# Patient Record
Sex: Male | Born: 1975 | Marital: Married | Smoking: Current every day smoker
Health system: Southern US, Community
[De-identification: ages and names within clinical notes are randomized; demographics above are authoritative.]

## PROBLEM LIST (undated history)

## (undated) DIAGNOSIS — R7303 Prediabetes: Secondary | ICD-10-CM

## (undated) DIAGNOSIS — E7800 Pure hypercholesterolemia, unspecified: Secondary | ICD-10-CM

## (undated) DIAGNOSIS — Z720 Tobacco use: Secondary | ICD-10-CM

## (undated) DIAGNOSIS — I2699 Other pulmonary embolism without acute cor pulmonale: Secondary | ICD-10-CM

## (undated) HISTORY — DX: Tobacco use: Z72.0

## (undated) HISTORY — DX: Pure hypercholesterolemia, unspecified: E78.00

## (undated) HISTORY — PX: COLONOSCOPY: SHX174

## (undated) HISTORY — DX: Other pulmonary embolism without acute cor pulmonale: I26.99

## (undated) HISTORY — DX: Prediabetes: R73.03

---

## 2002-08-14 ENCOUNTER — Emergency Department (HOSPITAL_COMMUNITY): Admission: EM | Admit: 2002-08-14 | Discharge: 2002-08-14 | Payer: Self-pay | Admitting: Emergency Medicine

## 2009-04-03 ENCOUNTER — Ambulatory Visit (HOSPITAL_COMMUNITY): Admission: AD | Admit: 2009-04-03 | Discharge: 2009-04-05 | Payer: Self-pay | Admitting: Family Medicine

## 2009-04-03 ENCOUNTER — Ambulatory Visit: Payer: Self-pay | Admitting: Family Medicine

## 2009-04-03 ENCOUNTER — Encounter: Admission: RE | Admit: 2009-04-03 | Discharge: 2009-04-03 | Payer: Self-pay | Admitting: Family Medicine

## 2009-04-04 ENCOUNTER — Encounter: Admission: AD | Admit: 2009-04-04 | Discharge: 2009-04-06 | Payer: Self-pay | Admitting: Family Medicine

## 2009-04-04 ENCOUNTER — Ambulatory Visit: Payer: Self-pay | Admitting: Vascular Surgery

## 2009-04-04 ENCOUNTER — Encounter: Payer: Self-pay | Admitting: Family Medicine

## 2009-04-11 DIAGNOSIS — F17200 Nicotine dependence, unspecified, uncomplicated: Secondary | ICD-10-CM

## 2009-04-12 ENCOUNTER — Ambulatory Visit: Payer: Self-pay | Admitting: Internal Medicine

## 2009-04-12 DIAGNOSIS — I2699 Other pulmonary embolism without acute cor pulmonale: Secondary | ICD-10-CM | POA: Insufficient documentation

## 2009-09-26 ENCOUNTER — Telehealth: Payer: Self-pay | Admitting: Internal Medicine

## 2009-10-17 ENCOUNTER — Ambulatory Visit: Payer: Self-pay | Admitting: Internal Medicine

## 2009-10-17 LAB — CONVERTED CEMR LAB
Anticardiolipin IgG: 3 (ref <23)
Protein C Activity: 132 % (ref 75–133)
Protein S Activity: 113 % (ref 69–129)
Protein S Ag, Total: 90 % (ref 70–140)

## 2009-10-25 ENCOUNTER — Telehealth: Payer: Self-pay | Admitting: Internal Medicine

## 2009-11-30 ENCOUNTER — Ambulatory Visit: Payer: Self-pay | Admitting: Cardiology

## 2009-11-30 LAB — CONVERTED CEMR LAB: POC INR: 1

## 2009-12-07 ENCOUNTER — Ambulatory Visit: Payer: Self-pay | Admitting: Cardiovascular Disease

## 2009-12-07 LAB — CONVERTED CEMR LAB: POC INR: 2.1

## 2009-12-14 ENCOUNTER — Ambulatory Visit: Payer: Self-pay | Admitting: Internal Medicine

## 2009-12-14 LAB — CONVERTED CEMR LAB: POC INR: 1.9

## 2009-12-28 ENCOUNTER — Ambulatory Visit: Payer: Self-pay | Admitting: Cardiology

## 2010-01-18 ENCOUNTER — Ambulatory Visit: Payer: Self-pay | Admitting: Cardiology

## 2010-01-18 LAB — CONVERTED CEMR LAB: POC INR: 1.7

## 2010-02-01 ENCOUNTER — Ambulatory Visit: Payer: Self-pay | Admitting: Internal Medicine

## 2010-02-01 LAB — CONVERTED CEMR LAB: POC INR: 2

## 2010-03-01 ENCOUNTER — Ambulatory Visit: Admit: 2010-03-01 | Payer: Self-pay

## 2010-03-07 ENCOUNTER — Ambulatory Visit: Admission: RE | Admit: 2010-03-07 | Discharge: 2010-03-07 | Payer: Self-pay | Source: Home / Self Care

## 2010-03-19 NOTE — Medication Information (Signed)
Summary: ccn/. 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 no    Recent/future dental: no  Any missed doses?: no       Is patient compliant with meds? yes      Comments: Has been off anticoagulation since early September.  Current Medications (verified): 1)  None  Allergies (verified): No Known Drug Allergies  Anticoagulation Management History:      The patient is taking warfarin and comes in today for a routine follow up visit.  Negative risk factors for bleeding include an age less than 61 years old.  The bleeding index is 'low risk'.  Negative CHADS2 values include Age > 75 years old.  Anticoagulation responsible provider: Myrtis Ser MD, Tinnie Gens.  INR POC: 1.0.  Cuvette Lot#: [id number].    Anticoagulation Management Assessment/Plan:      The next INR is due 12/07/2009.  Results were reviewed/authorized by Reina Fuse, PharmD.  He was notified by Reina Fuse PharmD.         Current Anticoagulation Instructions: INR 1.0  Take 5 mg (1 tablet) every day. Return to clinic in 1 week.

## 2010-03-19 NOTE — Medication Information (Signed)
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 no    Recent/future dental: no  Any missed doses?: no       Is patient compliant with meds? yes       Allergies: No Known Drug Allergies  Anticoagulation Management History:      The patient is taking warfarin and comes in today for a routine follow up visit.  Negative risk factors for bleeding include an age less than 76 years old.  The bleeding index is 'low risk'.  Negative CHADS2 values include Age > 71 years old.  Anticoagulation responsible provider: Bensimhon MD, Reuel Boom.  INR POC: 1.9.  Cuvette Lot#: [id number].  Exp: 01/2011.    Anticoagulation Management Assessment/Plan:      The patient's current anticoagulation dose is Warfarin sodium 5 mg tabs: Take as directed by anticoagulation clinic..  The target INR is 2-3.  The next INR is due 12/28/2009.  Anticoagulation instructions were given to patient.  Results were reviewed/authorized by Bethena Midget, RN, BSN.  He was notified by Bethena Midget, RN, BSN.         Prior Anticoagulation Instructions: INR 2.1  Continue Coumadin as scheduled:  1 tablet every day of the week.  Return to clinic in 1 week.    Current Anticoagulation Instructions: INR 1.9 Today 7.5mg s then change 5mg s daily except 7.5mg s on Wednesdays. Recheck in 2 weeks.

## 2010-03-19 NOTE — Medication Information (Signed)
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 yes       Details: took IBU for a few days  Recent/future dental: no  Any missed doses?: no       Is patient compliant with meds? yes       Allergies: No Known Drug Allergies  Anticoagulation Management History:      The patient is taking warfarin and comes in today for a routine follow up visit.  Negative risk factors for bleeding include an age less than 28 years old.  The bleeding index is 'low risk'.  Negative CHADS2 values include Age > 31 years old.  Anticoagulation responsible provider: Jens Som MD, Arlys John.  INR POC: 1.7.  Cuvette Lot#: [id number].  Exp: 01/2011.    Anticoagulation Management Assessment/Plan:      The patient's current anticoagulation dose is Warfarin sodium 5 mg tabs: Take as directed by anticoagulation clinic..  The target INR is 2-3.  The next INR is due 02/01/2010.  Anticoagulation instructions were given to patient.  Results were reviewed/authorized by Weston Brass, PharmD.  He was notified by Weston Brass PharmD.         Prior Anticoagulation Instructions: INR 2.4 Continue previous dose of 1 tablet everyday except 1.5 tablets on WEdnesday Recheck INR in 2 weeks  Current Anticoagulation Instructions: INR 1.7  Take 1 1/2 tablets today then increase dose to 1 tablet every day except 1 1/2 tablets on Wednesday and Saturday.  Recheck INR in 2 weeks.

## 2010-03-19 NOTE — Progress Notes (Signed)
Summary: DOCTOR REQUESTING TO SPEAK TO MR  Phone Note From Other Clinic Call back at [phone]   Caller: Dr. Patsy Lager Call For: [facility] Summary of Call: Dr. Patsy Lager wants to speak to MR when he comes in ref to research study this pt is involved in. Initial call taken by: Darletta Moll,  September 26, 2009 10:48 AM  Follow-up for Phone Call        Informed Chryl Heck. (who will be working with MR today) of this so she can remind him.  Will also forward message to MR so he is aware.  Gweneth Dimitri RN  September 26, 2009 10:53 AM   Additional Follow-up for Phone Call Additional follow up Details #1::        spoke to PRimary MD yesterday 09/26/2009. Advised to finish 6 month Rivoroxaban later this month. Then, seem off all RX a few weeks later and I will discuss hypercoag wu and furhter coumadin Rx  Jen: pls give appt for early september Additional Follow-up by: Kalman Shan MD,  September 27, 2009 10:27 AM    Additional Follow-up for Phone Call Additional follow up Details #2::    LMTCB. Carron Curie CMA  October 02, 2009 12:33 PM LMTCBx2. Carron Curie CMA  October 05, 2009 11:07 AM  pt set to see MR on 10-17-09. Carron Curie CMA  October 09, 2009 1:32 PM

## 2010-03-19 NOTE — Assessment & Plan Note (Signed)
Summary: rov/ mbw   Visit Type:  Follow-up Primary Provider/Referring Provider:  Dr Karleen Hampshire Copland  CC:  Pt here for follow-up. Pt denies any breathing problems. .  History of Present Illness: OV Feb 3158: 35 year old [place] male developed sudden onset of severe progressive dyspnea with exertioin but relivied by rest and associated wtih chest tightness around 04/01/2009. Diagnosed and admitted for bilateral pulmonary embolism on 04/03/2009 with associated Left popliteal vein DVT that did not appear acute. Review of admission chart suggests low PESI score - Class 1 with normal soidium levels implying a good prognosis. Initially had sinus tachycardia but this resolved soon. BP, POx, RR, mentnal status were all normal. Clinically did not have RV strain but we do not have ECHO, BNP or troponin. He is being treated with [facility] on the [facility] trial. In terms of risk factors, he now recollects that some weeks before PE he sustained a kick injyr on the left calf and there was some pain and bruising with it; this was the site of left poplieal dVT. He also had a 6h car trip to [place] but took lot of breaks. He is a smoker and there is possibly a family hx of PE/DVT. Of note, he did not have genetic workup for PE before admission. In any event, currently is asymptomatic and feels totally normal. He desires to quit smoking and is enquiring about it.   REC - finish 6 month rivorxaban  - quit smoking with chantix  - get more details on maternal grandfather's death from 'clot'   OV 2009-11-11: Followup PE/DVT and Smoking. Now sp 6 month rivoraxaban Rx for PE. Compeleted Rx few weesk ago. Feels well overall. Does note dyspnea 30 minutes into soccer game. This is improved since PE diagnosis but a few years ago he could play an hour before noticing dyspnea. He thinks it is due to weight gain and deconditioning. Still smokkes - never started chantix. Understands need to quit. Denies chest  pain, hemoptysis, edema, cough, syncope.    Preventive Screening-Counseling & Management  Alcohol-Tobacco     Smoking Status: current     Smoking Cessation Counseling: yes     Smoke Cessation Stage: contemplative     Packs/Day: 1.5     Year Started: 1994     Pack years: 25.5     Tobacco Counseling: to quit use of tobacco products  Comments: done but I dont think he is ready  Current Medications (verified): 1)  None  Allergies (verified): No Known Drug Allergies  Past History:  Past medical, surgical, family and social histories (including risk factors) reviewed, and no changes noted (except as noted below).  Past Medical History: Reviewed history from 04/12/2009 and no changes required. Current Problems:  TOBACCO ABUSE (ICD-305.1) PULMONARY EMBOLISM (ICD-415.19)   EKG at [place] sinus rhythm, tachycardiac.   Chest x-  ray at [facility] within normal limits.   Flu was negative.   CT angiogram is  reported to be positive for bilateral pulmonary embolisms.   CBC showed a white count of 9.2,  hemoglobin 15.1, and platelets 226.     Family History: Reviewed history from 04/12/2009 and no changes required. mother: HTN and anemia father: angina brother: good health Maternal grandfather was bedridden for 1 month and died of a 'clot" when patient was a teen. Further details not known. Updated this again oon 2009-11-11 - he says now that unclear what etiology of death  Social History: Reviewed history from 04/12/2009 and  no changes required. pt is married and lives with his wife. 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 pt smokes 1 1/2 ppd x 17 years.    He lives with his wife.  He works at [employer],   which he and his wife own.  He smokes a half pack per day of cigarettes.   He has been smoking for 17 years and used to smoke up to 3 packs per   day.  He does not drink, he does not do drugs. Smoking Status:  current Packs/Day:  1.5 Pack years:   25.5  Review of Systems  The patient denies shortness of breath with activity, shortness of breath at rest, productive cough, non-productive cough, coughing up blood, chest pain, irregular heartbeats, acid heartburn, indigestion, loss of appetite, weight change, abdominal pain, difficulty swallowing, sore throat, tooth/dental problems, headaches, nasal congestion/difficulty breathing through nose, sneezing, itching, ear ache, anxiety, depression, hand/feet swelling, joint stiffness or pain, rash, change in color of mucus, and fever.    Vital Signs:  Patient profile:   35 year old male Height:      72 inches Weight:      208.25 pounds O2 Sat:      95 % on Room air Temp:     98.8 degrees F oral Pulse rate:   78 / minute BP sitting:   116 / 74  (right arm) Cuff size:   regular  Vitals Entered By: Carron Curie CMA (October 17, 2009 4:01 PM)  O2 Flow:  Room air CC: Pt here for follow-up. Pt denies any breathing problems.    Physical Exam  General:  well developed, well nourished, in no acute distress Head:  normocephalic and atraumatic Eyes:  PERRLA/EOM intact; conjunctiva and sclera clear Ears:  TMs intact and clear with normal canals Nose:  no deformity, discharge, inflammation, or lesions Mouth:  no deformity or lesions Neck:  no masses, thyromegaly, or abnormal cervical nodes Chest Wall:  no deformities noted Lungs:  clear bilaterally to auscultation and percussion Heart:  regular rate and rhythm, S1, S2 without murmurs, rubs, gallops, or clicks Abdomen:  bowel sounds positive; abdomen soft and non-tender without masses, or organomegaly Msk:  no deformity or scoliosis noted with normal posture Pulses:  pulses normal Extremities:  no clubbing, cyanosis, edema, or deformity noted Neurologic:  CN II-XII grossly intact with normal reflexes, coordination, muscle strength and tone Skin:  intact without lesions or rashes Cervical Nodes:  no significant adenopathy Axillary Nodes:   no significant adenopathy Psych:  alert and cooperative; normal mood and affect; normal attention span and concentration   Impression & Recommendations:  Problem # 1:  PULMONARY EMBOLISM (ICD-415.1)  Orders: T- * Misc. Laboratory test [phone]) Tobacco use cessation intermediate 3-10 minutes ([id number])  He had Class 1 PESI score bilateral  PE with normal sodium on 04/03/2009. This was associated with LLE DVT in popliteal vein of indeterminate age. Based on this it is likely that the soccer injury to left calf weeks before admission associated with car trip to [place] in setting of cigarette smoking and possibile/likely family hx brought on PE.  > On 10/17/2009: s/p 6 months of rivoroxaban on [employer]  PROTOCOL TRIAL. Dpoing well. Some dyspnea 30 minutes into soccier game that is improved but not at baselne. STil smoking.   PLAN - Get hypercoag panel -> depending on abnormality he might need anticoag for life I- F negative, then would recommend total 1-4 years of  coumading for idiopathich PE with first year at INR >2 and then at INR > 1.5 - He refused this option though he undersands risk for recurrence is in order of 10-20% - Informed that if 2nd PE need anticoag for life - he understands - Quit smoking (a risk factor for PE) - Monitor dyspnea (he wants to clinically monitor this )  - FU depending on hypercoag resultsbut he prefers q 6months - q 1 year treatment  Problem # 2:  TOBACCO ABUSE (ICD-305.1) Assessment: Unchanged 5 minute counselling to quit esp in light of it being a PE Risk factor. HE is going to try on own.  Orders: T- * Misc. Laboratory test [phone]) Tobacco use cessation intermediate 3-10 minutes ([id number])  Other Orders: Est. Patient Level III (69629)  Patient Instructions: 1)  please have hyercoagulable workup 2)  I will call you with resutls and decide followup 3)  quit smoking please

## 2010-03-19 NOTE — Medication Information (Signed)
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 no    Recent/future dental: no  Any missed doses?: no       Is patient compliant with meds? yes       Allergies: No Known Drug Allergies  Anticoagulation Management History:      The patient is taking warfarin and comes in today for a routine follow up visit.  Negative risk factors for bleeding include an age less than 31 years old.  The bleeding index is 'low risk'.  Negative CHADS2 values include Age > 80 years old.  Anticoagulation responsible Jenetta Wease: Kristeen Miss.  INR POC: 2.1.  Cuvette Lot#: [id number].  Exp: 12/2010.    Anticoagulation Management Assessment/Plan:      The patient's current anticoagulation dose is Warfarin sodium 5 mg tabs: Take as directed by anticoagulation clinic..  The target INR is 2-3.  The next INR is due 12/14/2009.  Results were reviewed/authorized by Cloyde Reams, RN, BSN.  He was notified by Haynes Hoehn, PharmD Candidate.         Prior Anticoagulation Instructions: INR 1.0  Take 5 mg (1 tablet) every day. Return to clinic in 1 week.   Current Anticoagulation Instructions: INR 2.1  Continue Coumadin as scheduled:  1 tablet every day of the week.  Return to clinic in 1 week.   Prescriptions: WARFARIN SODIUM 5 MG TABS (WARFARIN SODIUM) Take as directed by anticoagulation clinic.  #30 x 1   Entered by:   Reina Fuse PharmD   Authorized by:   Sherrill Raring, MD, [facility]   Signed by:   Reina Fuse PharmD on 12/07/2009   Method used:   Electronically to        [facility]       [address]       [place], [place]  [address]       Ph: [phone]       Fax: [phone]   RxID:   [id number]

## 2010-03-19 NOTE — Assessment & Plan Note (Signed)
Summary: research pt- PE/apc   Visit Type:  Initial Consult Primary Provider/Referring Provider:  [facility]   CC:  Pt here for Pulmonary Consult. Pt is a research patient. Pt states he is "back to his usual self." .  History of Present Illness: 35 year old [place] male developed sudden onset of severe progressive dyspnea with exertioin but relivied by rest and associated wtih chest tightness around 04/01/2009. Diagnosed and admitted for bilateral pulmonary embolism on 04/03/2009 with associated Left popliteal vein DVT that did not appear acute. Review of admission chart suggests low PESI score - Class 1 with normal soidium levels implying a good prognosis. Initially had sinus tachycardia but this resolved soon. BP, POx, RR, mentnal status were all normal. Clinically did not have RV strain but we do not have ECHO, BNP or troponin. He is being treated with [facility] on the [facility] trial. In terms of risk factors, he now recollects that some weeks before PE he sustained a kick injyr on the left calf and there was some pain and bruising with it; this was the site of left poplieal dVT. He also had a 6h car trip to [place] but took lot of breaks. He is a smoker and there is possibly a family hx of PE/DVT. Of note, he did not have genetic workup for PE before admission  In any event, currently is asymptomatic and feels totally normal. He desires to quit smoking and is enquiring about it.   Current Medications (verified): 1)  Research Med .... Not Sure of Name  Allergies (verified): No Known Drug Allergies  Past History:  Family History: Last updated: 04/12/2009 mother: HTN and anemia father: angina brother: good health Maternal grandfather was bedridden for 1 month and died of a 'clot" when patient was a teen. Further details not known  Social History: Last updated: 04/12/2009 pt is married and lives with his wife. 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 pt  smokes 1 1/2 ppd x 17 years.    He lives with his wife.  He works at [employer],   which he and his wife own.  He smokes a half pack per day of cigarettes.   He has been smoking for 17 years and used to smoke up to 3 packs per   day.  He does not drink, he does not do drugs.   Past Medical History: Current Problems:  TOBACCO ABUSE (ICD-305.1) PULMONARY EMBOLISM (ICD-415.19)   EKG at [facility] sinus rhythm, tachycardiac.   Chest x-  ray at [facility] within normal limits.   Flu was negative.   CT angiogram is  reported to be positive for bilateral pulmonary embolisms.   CBC showed a white count of 9.2,  hemoglobin 15.1, and platelets 226.     Family History: mother: HTN and anemia father: angina brother: good health Maternal grandfather was bedridden for 1 month and died of a 'clot" when patient was a teen. Further details not known  Social History: pt is married and lives with his wife. 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 pt smokes 1 1/2 ppd x 17 years.    He lives with his wife.  He works at [employer],   which he and his wife own.  He smokes a half pack per day of cigarettes.   He has been smoking for 17 years and used to smoke up to 3 packs per   day.  He does not drink, he does not do drugs.   Vital Signs:  Patient profile:   35 year old male Height:      72 inches Weight:      213 pounds BMI:     28.99 O2 Sat:      97 % on Room air Temp:     98.2 degrees F oral Pulse rate:   92 / minute BP sitting:   108 / 72  (left arm) Cuff size:   regular  Vitals Entered By: Carron Curie CMA (April 12, 2009 3:29 PM)  O2 Flow:  Room air CC: Pt here for Pulmonary Consult. Pt is a research patient. Pt states he is "back to his usual self."  Comments Medications reviewed with patient Carron Curie CMA  April 12, 2009 3:30 PM Daytime phone number verified with patient.  Ambulatory Pulse Oximetry  Resting; HR_93____    02 Sat__94%  ra___  Lap1 (185 feet)   HR_92____   02 Sat__95% RA___ Lap2 (185 feet)   HR_95____   02 Sat___94% RA__    Lap3 (185 feet)   HR_103____   02 Sat__94% RA___  ___Test Completed without Difficulty ___Test Stopped due to:  Gweneth Dimitri RN  April 12, 2009 4:01 PM     Physical Exam  General:  well developed, well nourished, in no acute distress Head:  normocephalic and atraumatic Eyes:  PERRLA/EOM intact; conjunctiva and sclera clear Ears:  TMs intact and clear with normal canals Nose:  no deformity, discharge, inflammation, or lesions Mouth:  no deformity or lesions Neck:  no masses, thyromegaly, or abnormal cervical nodes Chest Wall:  no deformities noted Lungs:  clear bilaterally to auscultation and percussion Heart:  regular rate and rhythm, S1, S2 without murmurs, rubs, gallops, or clicks Abdomen:  bowel sounds positive; abdomen soft and non-tender without masses, or organomegaly Msk:  no deformity or scoliosis noted with normal posture Pulses:  pulses normal Extremities:  no clubbing, cyanosis, edema, or deformity noted Neurologic:  CN II-XII grossly intact with normal reflexes, coordination, muscle strength and tone Skin:  intact without lesions or rashes Cervical Nodes:  no significant adenopathy Axillary Nodes:  no significant adenopathy Psych:  alert and cooperative; normal mood and affect; normal attention span and concentration   CT of Chest  Procedure date:  04/03/2009  Findings:      Findings:  Filling defects are present in the pulmonary arteries   bilaterally compatible with moderate pulmonary emboli.  These are   present in the upper and lower lobe pulmonary arteries bilaterally.    The heart size is normal.  The aorta is normal.  The lungs are   clear without infiltrate or effusion.  There is no mass or   adenopathy.    Review of the MIP images confirms the above findings.    IMPRESSION:   Moderately extensive bilateral pulmonary  emboli.  Comments:      independelty revieweed  MISC. Report  Procedure date:  04/03/2009  Findings:      ESR 6 Na 136 Hgb 14.4gm% creat 1.36 alb 3.7gm%  MISC. Report  Procedure date:  04/04/2009  Findings:      Summary:    - No evidence of deep vein thrombosis involving the right lower     extremity.   - Findings consistent with indeterminate age deep vein thrombosis     involving the left lower extremity popliteal vein   - No evidence of Baker's cyst on the right or left.  Impression & Recommendations:  Problem # 1:  PULMONARY EMBOLISM (ICD-415.1) Assessment New He had Class 1 PESI score bilateral  PE with normal sodium on 04/03/2009. This was associated with LLE DVT in popliteal vein of indeterminate age. Based on this it is likely that the soccer injury to left calf weeks before admission associated with car trip to [place] in setting of cigarette smoking and possibile/likely family hx brought on PE.  PLAN I explained above to him Discussed risks of liver toxiicty and hemorrhage with PE treatment Expalined risk of recurrent fatal PE if he is non compliant Stressed importance of quitting smoking Asked him to get more family info about maternal grandpa who had a 'clot' We discussed duration of Rx. Due to the fact smoking and posible family hx: I would be inclinded to treat him beyond 6 months  I told him that a 2nd PE would mean anticoagulation for life Therefore might want to swithc Rivoroxaban to coumadin after 6 months (the trial duration) and continue for 1-2 years depending on willngness and tolerance He is willing for abovve At end of 6 months will consider gentic testing  Problem # 2:  TOBACCO ABUSE (ICD-305.1) Assessment: New  discussed for 5 monutes. Wants to quit. Chantix given. Has tried it in past without problems. Knows to report side effects of hallucinations, dreams etc., once it occurs. No firearms at home His updated medication list for this  problem includes:    Chantix Starting Month Pak 0.5 Mg X 11 & 1 Mg X 42 Misc (Varenicline tartrate) .[facility]... Take as directed---refills are to be for the continuing pak  Orders: New Patient Level V (60454) Tobacco use cessation intermediate 3-10 minutes (09811)  Medications Added to Medication List This Visit: 1)  Research Med  .... Not sure of name 2)  Chantix Starting Month Pak 0.5 Mg X 11 & 1 Mg X 42 Misc (Varenicline tartrate) .... Take as directed---refills are to be for the continuing pak  Patient Instructions: 1)  continue research treatment protocol 2)  take chantix as directed 3)  we will hold off on you seeing the blood doctor 4)  return to see me in 2-3 months  5)  quitting smoking is veyr important to help reduce your clot risk 6)  plesae find out how exactly your grandfather died and call me back Prescriptions: CHANTIX STARTING MONTH PAK 0.5 MG X 11 & 1 MG X 42  MISC (VARENICLINE TARTRATE) Take as directed---Refills are to be for the continuing pak  #1 x 3   Entered and Authorized by:   Kalman Shan MD   Signed by:   Kalman Shan MD on 04/12/2009   Method used:   Print then Give to Patient   RxID:   [id number]    Immunization History:  Influenza Immunization History:    Influenza:  fluvax 3+ (11/20/2008)

## 2010-03-19 NOTE — Medication Information (Signed)
Summary: rov/tm  Anticoagulant Therapy  Managed by: Weston Brass, PharmD Referring MD: Marchelle Gearing MD, Carmin Muskrat PCP: Dr Hannah Beat Supervising MD: Antoine Poche MD, Fayrene Fearing Indication 1: DVT/PE (recurrent or continuing risk factors) Lab Used: [facility] [place] Site: [address] INR POC 2.4 INR RANGE 2.0-3.0  Dietary changes: no    Health status changes: no    Bleeding/hemorrhagic complications: no    Recent/future hospitalizations: no    Any changes in medication regimen? no    Recent/future dental: no  Any missed doses?: no       Is patient compliant with meds? yes       Allergies: No Known Drug Allergies  Anticoagulation Management History:      The patient is taking warfarin and comes in today for a routine follow up visit.  Negative risk factors for bleeding include an age less than 15 years old.  The bleeding index is 'low risk'.  Negative CHADS2 values include Age > 38 years old.  Anticoagulation responsible Eriko Economos: Antoine Poche MD, Fayrene Fearing.  INR POC: 2.4.  Cuvette Lot#: [id number].  Exp: 12/2010.    Anticoagulation Management Assessment/Plan:      The patient's current anticoagulation dose is Warfarin sodium 5 mg tabs: Take as directed by anticoagulation clinic..  The target INR is 2-3.  The next INR is due 01/11/2010.  Anticoagulation instructions were given to patient.  Results were reviewed/authorized by Weston Brass, PharmD.  He was notified by Hoy Register, PharmD Candidate.         Prior Anticoagulation Instructions: INR 1.9 Today 7.5mg s then change 5mg s daily except 7.5mg s on Wednesdays. Recheck in 2 weeks.   Current Anticoagulation Instructions: INR 2.4 Continue previous dose of 1 tablet everyday except 1.5 tablets on WEdnesday Recheck INR in 2 weeks Prescriptions: WARFARIN SODIUM 5 MG TABS (WARFARIN SODIUM) Take as directed by anticoagulation clinic.  #90 x 0   Entered by:   Weston Brass PharmD   Authorized by:   Ferman Hamming, MD, [facility]  Signed by:   Weston Brass PharmD on 12/28/2009   Method used:   Electronically to        [facility] (retail)       [address]       [place], [place]  [address]       Ph: [phone]       Fax: [phone]   RxID:   [id number]

## 2010-03-19 NOTE — Progress Notes (Signed)
Summary: start coumadin-  Phone Note Outgoing Call   Call placed by: Kalman Shan MD,  October 25, 2009 6:18 PM Call placed to: Patient Summary of Call: hypercoag workup negative. Informed patient. Discussed further coumadin Rx. AGain explained that 2nd PE means coumadin for life. But risk for 2nd PE can be cut down if he did coumadin for another 1-4 years of which first year at INR >2 and then perhaps INR > 1.5. He is now willing to do coumadin for INR >2 for another 1 year.   So, pls give him appt at coumadin clinic to start coumadin (? needs lovenox bridge - have emailed Dr. Cyndie Chime about it).  He wants appt in 1-2 weeks . Pls call him and coordinate  FU with me in 10-12 months Initial call taken by: Kalman Shan MD,  October 25, 2009 6:21 PM  Follow-up for Phone Call        UPDATE: I got word unexpectedly from Dr. Riley Churches that [facility] is now available in the [place] commercially for stroke. He could take it for PE like he was but we need to get insurance approval etc. and need to figure out cost. Benefit is no monitoring. So, please tell him I will be calling next week to discuss again Follow-up by: Kalman Shan MD,  October 26, 2009 4:18 PM  Additional Follow-up for Phone Call Additional follow up Details #1::        spoke to patient. Explained rivoraxban available i [place]. Studies show efficacy equvalence in DVT and A Fib. PE results not published yet. Could be expensive. HE still prefers rivoroxaban over coumadin despite the unkonws and limitations. Wants me to try to get it approved with insurance.   So, [employer] direct and see what they say Additional Follow-up by: Kalman Shan MD,  October 30, 2009 1:41 PM    Additional Follow-up for Phone Call Additional follow up Details #2::    Called number in computer for insurance  that is listed but they states the pt insurance expired on 03-2009, so I have lMTCB with the pt to verify  insurance information since there is no updated info in EMR. Carron Curie CMA  November 01, 2009 11:56 AM   Pt returned call to [place].  He states he does have insurance but it is new.  Will fax copy of front and back of new card to traige fax # Attn: Victorino Dike.  Jennifer aware.   Follow-up by: Gweneth Dimitri RN,  November 05, 2009 11:12 AM  Additional Follow-up for Phone Call Additional follow up Details #3:: Details for Additional Follow-up Action Taken: I called pt insurance and spoke to Joanie Coddington, PharmD an dshe advised Rivoroxaban would be $240 per 34 day supply. I called and spoke to coumadin clinic and they advised the cost for appt would be specialists copay and the coumadin is on [facility] $4 list. I will forward infor to MR. Carron Curie CMA  November 19, 2009 2:08 PM   Called patient. Coumadin will be substantially cheaper in long run. Upfront with coumadin copays and INR checks will cost him same of rivroxaban but that will be like tha for first month or so. Then, cost will drop. He has to take Rx for long time so better he goes on coumadin. I called to explain this but LMTCB. IF he calls back, Jen pls tell him so and set him up at coumadin clinic Additional Follow-up by: Carmin Muskrat  Swayze Pries MD,  November 22, 2009 6:45 PM  advised pt of the above and he is ok to go to coumadin clinic. I have placed an order top [facility] to set pt up at Coumadin clinic. Carron Curie CMA  November 29, 2009 3:23 PM

## 2010-03-21 NOTE — Medication Information (Signed)
Summary: rov/sp  Anticoagulant Therapy  Managed by: Louann Sjogren, PharmD Referring MD: Marchelle Gearing MD, Carmin Muskrat PCP: Dr Hannah Beat Supervising MD: Daleen Squibb MD, Maisie Fus Indication 1: DVT/PE (recurrent or continuing risk factors) Lab Used: [facility] [place] Site: [address] INR POC 2.0 INR RANGE 2.0-3.0  Dietary changes: no    Health status changes: no    Bleeding/hemorrhagic complications: no    Recent/future hospitalizations: no    Any changes in medication regimen? no    Recent/future dental: no  Any missed doses?: no       Is patient compliant with meds? yes       Allergies: No Known Drug Allergies  Anticoagulation Management History:      The patient is taking warfarin and comes in today for a routine follow up visit.  Negative risk factors for bleeding include an age less than 94 years old.  The bleeding index is 'low risk'.  Negative CHADS2 values include Age > 7 years old.  Today's INR is 2.0.  Anticoagulation responsible provider: Daleen Squibb MD, Maisie Fus.  INR POC: 2.0.  Cuvette Lot#: [id number].  Exp: 01/2011.    Anticoagulation Management Assessment/Plan:      The patient's current anticoagulation dose is Warfarin sodium 5 mg tabs: Take as directed by anticoagulation clinic..  The target INR is 2-3.  The next INR is due 03/01/2010.  Anticoagulation instructions were given to patient.  Results were reviewed/authorized by Louann Sjogren, PharmD.  He was notified by Louann Sjogren, Pharm D.         Prior Anticoagulation Instructions: INR 1.7  Take 1 1/2 tablets today then increase dose to 1 tablet every day except 1 1/2 tablets on Wednesday and Saturday.  Recheck INR in 2 weeks.   Current Anticoagulation Instructions: INR 2.0 (goal 2-3)  Take 1 tablet everyday except take 1 1/2 tablets on Mondays, Wednesdays, and Fridays.  Return in 4 weeks on Friday, January 13th at 8:30AM.

## 2010-03-21 NOTE — Medication Information (Addendum)
Summary: coumadin ck/mt  Anticoagulant Therapy  Managed by: Weston Brass, PharmD Referring MD: Marchelle Gearing MD, Carmin Muskrat PCP: Dr Hannah Beat Supervising MD: Ladona Ridgel MD, Sharlot Gowda Indication 1: DVT/PE (recurrent or continuing risk factors) Lab Used: [facility] [place] Site: [address] INR POC 2.9 INR RANGE 2.0-3.0  Dietary changes: no    Health status changes: no    Bleeding/hemorrhagic complications: no    Recent/future hospitalizations: no    Any changes in medication regimen? no    Recent/future dental: no  Any missed doses?: yes     Details: Missed 1 dose a few weeks ago   Is patient compliant with meds? yes       Allergies: No Known Drug Allergies  Anticoagulation Management History:      The patient is taking warfarin and comes in today for a routine follow up visit.  Negative risk factors for bleeding include an age less than 58 years old.  The bleeding index is 'low risk'.  Negative CHADS2 values include Age > 26 years old.  His last INR was 2.0.  Anticoagulation responsible provider: Ladona Ridgel MD, Sharlot Gowda.  INR POC: 2.9.  Cuvette Lot#: [id number].  Exp: 02/2011.    Anticoagulation Management Assessment/Plan:      The patient's current anticoagulation dose is Warfarin sodium 5 mg tabs: Take as directed by anticoagulation clinic..  The target INR is 2-3.  The next INR is due 04/11/2010.  Anticoagulation instructions were given to patient.  Results were reviewed/authorized by Weston Brass, PharmD.  He was notified by Stephannie Peters, PharmD Candidate .         Prior Anticoagulation Instructions: INR 2.0 (goal 2-3)  Take 1 tablet everyday except take 1 1/2 tablets on Mondays, Wednesdays, and Fridays.  Return in 4 weeks on Friday, January 13th at 8:30AM.  Current Anticoagulation Instructions: INR 2.9  Coumadin 5 mg tablets - Continue 1 tablet every day except 1.5 tablets on Mondays, Wednesdays, and Fridays  Prescriptions: WARFARIN SODIUM 5 MG TABS (WARFARIN  SODIUM) Take as directed by anticoagulation clinic.  #90 x 0   Entered by:   Cloyde Reams RN   Authorized by:   Laren Boom, MD, [facility]   Signed by:   Cloyde Reams RN on 03/07/2010   Method used:   Electronically to        [facility] (retail)       [address]       [place], [place]  [address]       Ph: [phone]       Fax: [phone]   RxID:   [id number]   Appended Document: coumadin ck/mt jen, he needs to see me around 9 months or so from now  Appended Document: coumadin ck/mt reminder placed.

## 2010-03-28 DIAGNOSIS — I2699 Other pulmonary embolism without acute cor pulmonale: Secondary | ICD-10-CM

## 2010-04-11 ENCOUNTER — Encounter: Payer: Self-pay | Admitting: Cardiology

## 2010-04-11 ENCOUNTER — Encounter (INDEPENDENT_AMBULATORY_CARE_PROVIDER_SITE_OTHER): Payer: PRIVATE HEALTH INSURANCE

## 2010-04-11 DIAGNOSIS — I80299 Phlebitis and thrombophlebitis of other deep vessels of unspecified lower extremity: Secondary | ICD-10-CM

## 2010-04-11 DIAGNOSIS — Z7901 Long term (current) use of anticoagulants: Secondary | ICD-10-CM

## 2010-04-11 DIAGNOSIS — I2699 Other pulmonary embolism without acute cor pulmonale: Secondary | ICD-10-CM

## 2010-04-11 LAB — CONVERTED CEMR LAB: POC INR: 3.1

## 2010-04-16 NOTE — Medication Information (Signed)
Summary: ccr/.tmj  Anticoagulant Therapy  Managed by: Windell Hummingbird, RN Referring MD: Marchelle Gearing MD, Carmin Muskrat PCP: Dr Hannah Beat Supervising MD: Myrtis Ser MD, Tinnie Gens Indication 1: DVT/PE (recurrent or continuing risk factors) Lab Used: [facility] [place] Site: [address] INR POC 3.1 INR RANGE 2.0-3.0  Dietary changes: no    Health status changes: no    Bleeding/hemorrhagic complications: no    Recent/future hospitalizations: no    Any changes in medication regimen? no    Recent/future dental: no  Any missed doses?: no       Is patient compliant with meds? yes       Allergies: No Known Drug Allergies  Anticoagulation Management History:      The patient is taking warfarin and comes in today for a routine follow up visit.  Negative risk factors for bleeding include an age less than 34 years old.  The bleeding index is 'low risk'.  Negative CHADS2 values include Age > 34 years old.  His last INR was 2.0.  Anticoagulation responsible provider: Myrtis Ser MD, Tinnie Gens.  INR POC: 3.1.  Cuvette Lot#: [id number].  Exp: 02/2011.    Anticoagulation Management Assessment/Plan:      The patient's current anticoagulation dose is Warfarin sodium 5 mg tabs: Take as directed by anticoagulation clinic..  The target INR is 2-3.  The next INR is due 05/16/2010.  Anticoagulation instructions were given to patient.  Results were reviewed/authorized by Windell Hummingbird, RN.  He was notified by Windell Hummingbird, RN.         Prior Anticoagulation Instructions: INR 2.9  Coumadin 5 mg tablets - Continue 1 tablet every day except 1.5 tablets on Mondays, Wednesdays, and Fridays   Current Anticoagulation Instructions: INR 3.1 Take 1/2 tablets today.Continue taking 1 tablet every day, except take 1 1/2 tablets on Mondays, Wednesdays, and Fridays. Recheck in 5 weeks.

## 2010-05-09 LAB — CBC
MCHC: 34.9 g/dL (ref 30.0–36.0)
Platelets: 156 10*3/uL (ref 150–400)
RBC: 4.85 MIL/uL (ref 4.22–5.81)

## 2010-05-09 LAB — COMPREHENSIVE METABOLIC PANEL
ALT: 26 U/L (ref 0–53)
Albumin: 3.7 g/dL (ref 3.5–5.2)
BUN: 15 mg/dL (ref 6–23)
GFR calc Af Amer: >60 mL/min (ref >60)
Potassium: 3.9 mEq/L (ref 3.5–5.1)
Sodium: 136 mEq/L (ref 135–145)
Total Bilirubin: 0.6 mg/dL (ref 0.3–1.2)
Total Protein: 6.4 g/dL (ref 6.0–8.3)

## 2010-05-09 LAB — LIPID PANEL
Cholesterol: 154 mg/dL (ref 0–200)
HDL: 35 mg/dL — ABNORMAL LOW (ref >39)
LDL Cholesterol: 64 mg/dL (ref 0–99)
Total CHOL/HDL Ratio: 4.4 RATIO
Triglycerides: 276 mg/dL — ABNORMAL HIGH (ref <150)
VLDL: 55 mg/dL — ABNORMAL HIGH (ref 0–40)

## 2010-05-09 LAB — PROTIME-INR
INR: 1.04 (ref 0.00–1.49)
INR: 1.08 (ref 0.00–1.49)
INR: 2.18 — ABNORMAL HIGH (ref 0.00–1.49)
Prothrombin Time: 24.1 seconds — ABNORMAL HIGH (ref 11.6–15.2)

## 2010-05-09 LAB — BASIC METABOLIC PANEL
CO2: 25 mEq/L (ref 19–32)
Calcium: 8.8 mg/dL (ref 8.4–10.5)
Creatinine, Ser: 1.36 mg/dL (ref 0.4–1.5)
GFR calc Af Amer: >60 mL/min (ref >60)
GFR calc non Af Amer: >60 mL/min (ref >60)

## 2010-05-09 LAB — LIPASE, BLOOD: Lipase: 21 U/L (ref 11–59)

## 2010-05-09 LAB — MRSA PCR SCREENING: MRSA by PCR: NEGATIVE

## 2010-05-09 LAB — SEDIMENTATION RATE: Sed Rate: 6 mm/hr (ref 0–16)

## 2010-05-16 ENCOUNTER — Ambulatory Visit (INDEPENDENT_AMBULATORY_CARE_PROVIDER_SITE_OTHER): Payer: PRIVATE HEALTH INSURANCE | Admitting: *Deleted

## 2010-05-16 DIAGNOSIS — I2699 Other pulmonary embolism without acute cor pulmonale: Secondary | ICD-10-CM

## 2010-05-16 NOTE — Patient Instructions (Signed)
INR 2.5 Cont taking current regimen Return to clinic in 4 weeks

## 2010-06-10 ENCOUNTER — Telehealth: Payer: Self-pay | Admitting: Internal Medicine

## 2010-06-10 MED ORDER — WARFARIN SODIUM 5 MG PO TABS: ORAL_TABLET | ORAL | Status: DC

## 2010-06-10 NOTE — Telephone Encounter (Signed)
Refill of coumadin 5mg 

## 2010-06-13 ENCOUNTER — Encounter: Payer: PRIVATE HEALTH INSURANCE | Admitting: *Deleted

## 2010-06-19 ENCOUNTER — Ambulatory Visit (INDEPENDENT_AMBULATORY_CARE_PROVIDER_SITE_OTHER): Payer: PRIVATE HEALTH INSURANCE | Admitting: *Deleted

## 2010-06-19 DIAGNOSIS — I2699 Other pulmonary embolism without acute cor pulmonale: Secondary | ICD-10-CM

## 2010-06-19 LAB — POCT INR: INR: 2.6

## 2010-07-17 ENCOUNTER — Encounter: Payer: PRIVATE HEALTH INSURANCE | Admitting: *Deleted

## 2010-07-26 ENCOUNTER — Other Ambulatory Visit: Payer: Self-pay

## 2010-07-26 ENCOUNTER — Ambulatory Visit (INDEPENDENT_AMBULATORY_CARE_PROVIDER_SITE_OTHER): Payer: PRIVATE HEALTH INSURANCE | Admitting: *Deleted

## 2010-07-26 DIAGNOSIS — I2699 Other pulmonary embolism without acute cor pulmonale: Secondary | ICD-10-CM

## 2010-07-26 MED ORDER — WARFARIN SODIUM 5 MG PO TABS: ORAL_TABLET | ORAL | Status: DC

## 2010-08-23 ENCOUNTER — Encounter: Payer: PRIVATE HEALTH INSURANCE | Admitting: *Deleted

## 2010-08-26 ENCOUNTER — Ambulatory Visit (INDEPENDENT_AMBULATORY_CARE_PROVIDER_SITE_OTHER): Payer: PRIVATE HEALTH INSURANCE | Admitting: *Deleted

## 2010-08-26 DIAGNOSIS — I2699 Other pulmonary embolism without acute cor pulmonale: Secondary | ICD-10-CM

## 2010-09-23 ENCOUNTER — Ambulatory Visit (INDEPENDENT_AMBULATORY_CARE_PROVIDER_SITE_OTHER): Payer: PRIVATE HEALTH INSURANCE | Admitting: *Deleted

## 2010-09-23 DIAGNOSIS — I2699 Other pulmonary embolism without acute cor pulmonale: Secondary | ICD-10-CM

## 2010-09-23 MED ORDER — WARFARIN SODIUM 5 MG PO TABS: ORAL_TABLET | ORAL | Status: DC

## 2010-10-23 ENCOUNTER — Ambulatory Visit (INDEPENDENT_AMBULATORY_CARE_PROVIDER_SITE_OTHER): Payer: PRIVATE HEALTH INSURANCE | Admitting: *Deleted

## 2010-10-23 DIAGNOSIS — I2699 Other pulmonary embolism without acute cor pulmonale: Secondary | ICD-10-CM

## 2010-11-20 ENCOUNTER — Ambulatory Visit (INDEPENDENT_AMBULATORY_CARE_PROVIDER_SITE_OTHER): Payer: PRIVATE HEALTH INSURANCE | Admitting: *Deleted

## 2010-11-20 DIAGNOSIS — I2699 Other pulmonary embolism without acute cor pulmonale: Secondary | ICD-10-CM

## 2010-11-20 LAB — POCT INR: INR: 2.4

## 2010-12-16 ENCOUNTER — Ambulatory Visit (INDEPENDENT_AMBULATORY_CARE_PROVIDER_SITE_OTHER): Payer: PRIVATE HEALTH INSURANCE | Admitting: *Deleted

## 2010-12-16 DIAGNOSIS — I2699 Other pulmonary embolism without acute cor pulmonale: Secondary | ICD-10-CM

## 2010-12-16 LAB — POCT INR: INR: 2.5

## 2010-12-18 ENCOUNTER — Encounter: Payer: PRIVATE HEALTH INSURANCE | Admitting: *Deleted

## 2011-01-17 ENCOUNTER — Other Ambulatory Visit: Payer: Self-pay | Admitting: *Deleted

## 2011-01-17 ENCOUNTER — Other Ambulatory Visit: Payer: Self-pay

## 2011-01-17 MED ORDER — WARFARIN SODIUM 5 MG PO TABS: ORAL_TABLET | ORAL | Status: DC

## 2011-01-17 MED ORDER — WARFARIN SODIUM 5 MG PO TABS: 5.0000 mg | ORAL_TABLET | ORAL | Status: DC

## 2011-01-17 NOTE — Telephone Encounter (Signed)
New Msg: Pt calling needing refill. Please call in ASAP.

## 2011-01-27 ENCOUNTER — Ambulatory Visit (INDEPENDENT_AMBULATORY_CARE_PROVIDER_SITE_OTHER): Payer: PRIVATE HEALTH INSURANCE | Admitting: *Deleted

## 2011-01-27 ENCOUNTER — Encounter: Payer: PRIVATE HEALTH INSURANCE | Admitting: *Deleted

## 2011-01-27 DIAGNOSIS — I2699 Other pulmonary embolism without acute cor pulmonale: Secondary | ICD-10-CM

## 2011-02-25 ENCOUNTER — Encounter: Payer: Self-pay | Admitting: Internal Medicine

## 2011-02-26 ENCOUNTER — Ambulatory Visit (INDEPENDENT_AMBULATORY_CARE_PROVIDER_SITE_OTHER): Payer: PRIVATE HEALTH INSURANCE | Admitting: Internal Medicine

## 2011-02-26 ENCOUNTER — Encounter: Payer: Self-pay | Admitting: Internal Medicine

## 2011-02-26 DIAGNOSIS — F17200 Nicotine dependence, unspecified, uncomplicated: Secondary | ICD-10-CM

## 2011-02-26 DIAGNOSIS — R0989 Other specified symptoms and signs involving the circulatory and respiratory systems: Secondary | ICD-10-CM

## 2011-02-26 DIAGNOSIS — Z23 Encounter for immunization: Secondary | ICD-10-CM

## 2011-02-26 DIAGNOSIS — R0600 Dyspnea, unspecified: Secondary | ICD-10-CM

## 2011-02-26 DIAGNOSIS — I2699 Other pulmonary embolism without acute cor pulmonale: Secondary | ICD-10-CM

## 2011-02-26 NOTE — Progress Notes (Signed)
Subjective:    Patient ID: Marvin Escobar, male    DOB: 1975-08-07, 36 y.o.   MRN: [id number]  HPI Problem List # . 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 Low PESI score - Class 1 with normal soidium level.  - Rx 6 month RIVOROXABAN on the [facility] Open Labelled trial -ending August 2011 -  Started coumadin mid-end Oct 2011 goal INR >2 for 1-2 years, then INR goal > 1.5 another 1-2 years  - Risk factors, kick injyr on the left calf weeks before PE, 6h car trip to [place] but took breaks,  grandfather died in [place] from PE possibly (details NA), and smoking. Negative hypercoag wiorkup sept 2011 (post rivoroxaban)   #Tobacco Abuse  - since age 66, 37-15ppd, in past 3ppd  - never started chantix 2011   OV Feb 25, 2009: Followup PE/DVT and Smoking. He has almost completed 2 years of anticoagulation except for 1-2 month gap during switch from rivoroxaban to coumadin and discussions that followed. Per hx and chart review show therapeutic INR at coumadin clinic. He has adverse effects with coumadin though prefers rivoroxaban which we had calculated was going to be way more expensive for him. He has gained some weight. He is not exercising as much as he used to. Still has mild dyspnea for soccer games and jogging runs; never used to have it pre-PE but has it post-PE but stable since onset. He feels this is due to deconditioning. Relieved by rest.  Still smokkes - never started chantix. Understands need to quit but says he loves cigarettes and unllikely to quit.  Denies chest pain, hemoptysis, edema, cough, syncope.     Review of Systems  Constitutional: Negative for fever and unexpected weight change.  HENT: Positive for dental problem and sinus pressure. Negative for ear pain, nosebleeds, congestion, sore throat, rhinorrhea, sneezing, trouble swallowing and postnasal drip.   Eyes: Negative for redness and itching.  Respiratory:  Negative for cough, chest tightness, shortness of breath and wheezing.   Cardiovascular: Negative for palpitations and leg swelling.  Gastrointestinal: Negative for nausea and vomiting.  Genitourinary: Negative for dysuria.  Musculoskeletal: Negative for joint swelling.  Skin: Negative for rash.  Neurological: Negative for headaches.  Hematological: Does not bruise/bleed easily.  Psychiatric/Behavioral: Negative for dysphoric mood. The patient is not nervous/anxious.        Objective:   Physical Exam  Nursing note and vitals reviewed. Constitutional: He is oriented to person, place, and time. He appears well-developed and well-nourished. No distress.  HENT:  Head: Normocephalic and atraumatic.  Right Ear: External ear normal.  Left Ear: External ear normal.  Mouth/Throat: Oropharynx is clear and moist. No oropharyngeal exudate.  Eyes: Conjunctivae and EOM are normal. Pupils are equal, round, and reactive to light. Right eye exhibits no discharge. Left eye exhibits no discharge. No scleral icterus.  Neck: Normal range of motion. Neck supple. No JVD present. No tracheal deviation present. No thyromegaly present.  Cardiovascular: Normal rate, regular rhythm and intact distal pulses.  Exam reveals no gallop and no friction rub.   No murmur heard. Pulmonary/Chest: Effort normal and breath sounds normal. No respiratory distress. He has no wheezes. He has no rales. He exhibits no tenderness.  Abdominal: Soft. Bowel sounds are normal. He exhibits no distension and no mass. There is no tenderness. There is no rebound and no guarding.  Musculoskeletal: Normal range of motion. He exhibits no edema and no tenderness.  Lymphadenopathy:  He has no cervical adenopathy.  Neurological: He is alert and oriented to person, place, and time. He has normal reflexes. No cranial nerve deficit. Coordination normal.  Skin: Skin is warm and dry. No rash noted. He is not diaphoretic. No erythema. No pallor.    Psychiatric: He has a normal mood and affect. His behavior is normal. Judgment and thought content normal.          Assessment & Plan:

## 2011-02-26 NOTE — Patient Instructions (Signed)
#  PUlmonary EMbolism  - you have completed nearly 2 years of anticoagulation this mid-feb 2013  - continue coumadin at current dose for INR > 2 till mid-feb 2013  - after that coumadin dose to be reduced for INR goal > 1.5 which I want you to take for another 1 year atleast  - this is to prevent risk for 2nd clot which will mean coumadin for life - wil llet Tiffany Muse at coumadin clinic know about this plan #Shortness;  of breath  - this is likely due to lack of fitness - if this persists might need echo of the heart #Smoking  - this is a big risk factor for PE. Please quit #Health   - flu shot today #Followup  -  August 2013 or sooner if needed

## 2011-03-03 ENCOUNTER — Telehealth: Payer: Self-pay | Admitting: Internal Medicine

## 2011-03-03 DIAGNOSIS — R0600 Dyspnea, unspecified: Secondary | ICD-10-CM | POA: Insufficient documentation

## 2011-03-03 NOTE — Telephone Encounter (Signed)
Hello Marvin Escobar  STarting mid -feb 2013 we are lowering his INR goal to > 1.5 Please take note  Thanks  MR

## 2011-03-03 NOTE — Assessment & Plan Note (Signed)
This is new since PE. DDx is CTEPH though clinical exam does not support v deconditioning (he thinks so) v early onset copd/asthma. Will monitor this. Will get PFts depending on course. He prefers observation for now

## 2011-03-03 NOTE — Assessment & Plan Note (Signed)
Refused to quit. Says he loves cigarettes. Warned him of various risks including that of PE. He understands. Will continue to provide support

## 2011-03-03 NOTE — Assessment & Plan Note (Signed)
#  PUlmonary EMbolism  - you have completed nearly 2 years of anticoagulation this mid-feb 2013  - continue coumadin at current dose for INR > 2 till mid-feb 2013  - after that coumadin dose to be reduced for INR goal > 1.5 which I want you to take for another 1 year atleast  - this is to prevent risk for 2nd clot which will mean coumadin for life - wil llet Tiffany Muse at coumadin clinic know about this plan  We discussed various options about anticoagulation. Again explained recent data all supporting prolonoged anticoagulation in indiopathic PE. Concern is that risk of PE is higheest in first few years and if 2nd PE, he has to be on coumadin for life. Therefore, we agreed better to anticoagulate for prolonged period now. After feb 2013 will  Maintain on INR goal > 1.5 for 2 years, then daily aspirin based on WARFASA study. HE prefers rivoroxaban but this is expensive for him  > 50% of this > 25 min visit spent in face to face counseling

## 2011-03-10 ENCOUNTER — Encounter: Payer: PRIVATE HEALTH INSURANCE | Admitting: *Deleted

## 2011-04-23 ENCOUNTER — Ambulatory Visit: Payer: PRIVATE HEALTH INSURANCE

## 2011-04-23 ENCOUNTER — Telehealth: Payer: Self-pay | Admitting: Internal Medicine

## 2011-04-23 ENCOUNTER — Ambulatory Visit (INDEPENDENT_AMBULATORY_CARE_PROVIDER_SITE_OTHER): Payer: PRIVATE HEALTH INSURANCE | Admitting: *Deleted

## 2011-04-23 DIAGNOSIS — I2699 Other pulmonary embolism without acute cor pulmonale: Secondary | ICD-10-CM

## 2011-04-23 LAB — POCT INR: INR: 2.9

## 2011-04-23 NOTE — Telephone Encounter (Signed)
His INR Goal is  1.5 - 2.0

## 2011-04-24 ENCOUNTER — Ambulatory Visit: Payer: Self-pay | Admitting: *Deleted

## 2011-05-09 ENCOUNTER — Ambulatory Visit (INDEPENDENT_AMBULATORY_CARE_PROVIDER_SITE_OTHER): Payer: PRIVATE HEALTH INSURANCE

## 2011-05-09 DIAGNOSIS — I2699 Other pulmonary embolism without acute cor pulmonale: Secondary | ICD-10-CM

## 2011-05-09 LAB — POCT INR: INR: 2.2

## 2011-05-12 ENCOUNTER — Other Ambulatory Visit: Payer: Self-pay | Admitting: *Deleted

## 2011-05-12 MED ORDER — WARFARIN SODIUM 5 MG PO TABS: 5.0000 mg | ORAL_TABLET | ORAL | Status: DC

## 2011-05-30 ENCOUNTER — Ambulatory Visit (INDEPENDENT_AMBULATORY_CARE_PROVIDER_SITE_OTHER): Payer: Self-pay

## 2011-05-30 DIAGNOSIS — I2699 Other pulmonary embolism without acute cor pulmonale: Secondary | ICD-10-CM

## 2011-08-22 ENCOUNTER — Ambulatory Visit (INDEPENDENT_AMBULATORY_CARE_PROVIDER_SITE_OTHER): Payer: Self-pay | Admitting: *Deleted

## 2011-08-22 DIAGNOSIS — Z7901 Long term (current) use of anticoagulants: Secondary | ICD-10-CM

## 2011-08-22 DIAGNOSIS — I2699 Other pulmonary embolism without acute cor pulmonale: Secondary | ICD-10-CM

## 2011-08-22 DIAGNOSIS — Z86711 Personal history of pulmonary embolism: Secondary | ICD-10-CM

## 2011-08-22 MED ORDER — WARFARIN SODIUM 5 MG PO TABS: ORAL_TABLET | ORAL | Status: DC

## 2011-09-19 ENCOUNTER — Ambulatory Visit (INDEPENDENT_AMBULATORY_CARE_PROVIDER_SITE_OTHER): Payer: Self-pay

## 2011-09-19 DIAGNOSIS — Z86711 Personal history of pulmonary embolism: Secondary | ICD-10-CM

## 2011-09-19 DIAGNOSIS — Z7901 Long term (current) use of anticoagulants: Secondary | ICD-10-CM

## 2011-09-19 MED ORDER — WARFARIN SODIUM 5 MG PO TABS: ORAL_TABLET | ORAL | Status: DC

## 2011-09-23 ENCOUNTER — Telehealth: Payer: Self-pay | Admitting: Internal Medicine

## 2011-09-23 NOTE — Telephone Encounter (Signed)
Called pt to schedule follow up apt x3.Sent letter 09/15/11. ° °

## 2011-10-17 ENCOUNTER — Ambulatory Visit (INDEPENDENT_AMBULATORY_CARE_PROVIDER_SITE_OTHER): Payer: PRIVATE HEALTH INSURANCE

## 2011-10-17 DIAGNOSIS — Z7901 Long term (current) use of anticoagulants: Secondary | ICD-10-CM

## 2011-10-17 DIAGNOSIS — Z86711 Personal history of pulmonary embolism: Secondary | ICD-10-CM

## 2011-11-14 ENCOUNTER — Ambulatory Visit (INDEPENDENT_AMBULATORY_CARE_PROVIDER_SITE_OTHER): Payer: PRIVATE HEALTH INSURANCE

## 2011-11-14 DIAGNOSIS — I2699 Other pulmonary embolism without acute cor pulmonale: Secondary | ICD-10-CM

## 2011-11-14 LAB — POCT INR: INR: 1.6

## 2011-12-12 ENCOUNTER — Ambulatory Visit (INDEPENDENT_AMBULATORY_CARE_PROVIDER_SITE_OTHER): Payer: PRIVATE HEALTH INSURANCE | Admitting: Pharmacist

## 2011-12-12 DIAGNOSIS — I2699 Other pulmonary embolism without acute cor pulmonale: Secondary | ICD-10-CM

## 2011-12-12 MED ORDER — WARFARIN SODIUM 5 MG PO TABS: ORAL_TABLET | ORAL | Status: DC

## 2012-02-03 ENCOUNTER — Ambulatory Visit (INDEPENDENT_AMBULATORY_CARE_PROVIDER_SITE_OTHER): Payer: PRIVATE HEALTH INSURANCE | Admitting: Pharmacist

## 2012-02-03 DIAGNOSIS — I2699 Other pulmonary embolism without acute cor pulmonale: Secondary | ICD-10-CM

## 2012-02-03 MED ORDER — WARFARIN SODIUM 5 MG PO TABS: ORAL_TABLET | ORAL | Status: DC

## 2012-03-16 ENCOUNTER — Ambulatory Visit (INDEPENDENT_AMBULATORY_CARE_PROVIDER_SITE_OTHER): Payer: PRIVATE HEALTH INSURANCE | Admitting: *Deleted

## 2012-03-16 DIAGNOSIS — I2699 Other pulmonary embolism without acute cor pulmonale: Secondary | ICD-10-CM

## 2012-03-16 LAB — POCT INR: INR: 1.6

## 2012-04-27 ENCOUNTER — Ambulatory Visit (INDEPENDENT_AMBULATORY_CARE_PROVIDER_SITE_OTHER): Payer: PRIVATE HEALTH INSURANCE | Admitting: *Deleted

## 2012-04-27 DIAGNOSIS — Z86711 Personal history of pulmonary embolism: Secondary | ICD-10-CM

## 2012-04-27 LAB — POCT INR: INR: 2.7

## 2012-04-27 MED ORDER — WARFARIN SODIUM 5 MG PO TABS: ORAL_TABLET | ORAL | Status: DC

## 2012-05-18 ENCOUNTER — Ambulatory Visit (INDEPENDENT_AMBULATORY_CARE_PROVIDER_SITE_OTHER): Payer: PRIVATE HEALTH INSURANCE | Admitting: *Deleted

## 2012-05-18 DIAGNOSIS — Z86711 Personal history of pulmonary embolism: Secondary | ICD-10-CM

## 2012-05-18 DIAGNOSIS — Z7901 Long term (current) use of anticoagulants: Secondary | ICD-10-CM

## 2012-05-30 ENCOUNTER — Telehealth: Payer: Self-pay | Admitting: Internal Medicine

## 2012-05-30 NOTE — Telephone Encounter (Signed)
Please have him come and see me for 1 year followup of his pulmonary embolism. We need to discuss continuing Coumadin which he has been since 2011 for the past 3 years versus doing just aspirin versus to new tablet xarelto which he has always been interested in   Dr. Kalman Shan, M.D., [facility].C.P Pulmonary and Critical Care Medicine Staff Physician [facility] [facility] [place] [facility] Pager: [phone], If no answer or between  15:00h - 7:00h: call [phone]  05/30/2012 3:57 PM

## 2012-06-07 NOTE — Telephone Encounter (Signed)
[email or web address].Jennifer Castillo, CMA  

## 2012-06-11 NOTE — Telephone Encounter (Signed)
[email or web address]. Loucille Takach, CMA  

## 2012-06-18 NOTE — Telephone Encounter (Signed)
I spoke with the pt and he states he has been stuck out of town and when he comes back into town he will call to set-up appt.Carron Curie, CMA

## 2012-06-30 ENCOUNTER — Telehealth: Payer: Self-pay | Admitting: Internal Medicine

## 2012-06-30 ENCOUNTER — Encounter: Payer: Self-pay | Admitting: Internal Medicine

## 2012-06-30 ENCOUNTER — Other Ambulatory Visit (INDEPENDENT_AMBULATORY_CARE_PROVIDER_SITE_OTHER): Payer: PRIVATE HEALTH INSURANCE

## 2012-06-30 ENCOUNTER — Ambulatory Visit (INDEPENDENT_AMBULATORY_CARE_PROVIDER_SITE_OTHER): Payer: PRIVATE HEALTH INSURANCE | Admitting: Internal Medicine

## 2012-06-30 VITALS — BP 120/80 | HR 62 | Ht 72.0 in | Wt 221.0 lb

## 2012-06-30 DIAGNOSIS — I2699 Other pulmonary embolism without acute cor pulmonale: Secondary | ICD-10-CM

## 2012-06-30 DIAGNOSIS — F17200 Nicotine dependence, unspecified, uncomplicated: Secondary | ICD-10-CM

## 2012-06-30 LAB — BASIC METABOLIC PANEL
BUN: 16 mg/dL (ref 6–23)
Calcium: 9.3 mg/dL (ref 8.4–10.5)
GFR: 72.13 mL/min (ref >60.00)
Glucose, Bld: 105 mg/dL — ABNORMAL HIGH (ref 70–99)

## 2012-06-30 NOTE — Progress Notes (Signed)
Subjective:    Patient ID: Marvin Escobar, male    DOB: 1975/11/30, 37 y.o.   MRN: [id number]  HPI   HPI Problem List # . 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 Low PESI score - Class 1 with normal soidium level.  - Rx 6 month RIVOROXABAN on the [facility] Open Labelled trial -ending August 2011 -  Started coumadin mid-end Oct 2011 goal INR >2 for 1-2 years, then INR goal > 1.5 another 1-2 years  - Risk factors, kick injyr on the left calf weeks before PE, 6h car trip to [place] but took breaks,  grandfather died in [place] from PE possibly (details NA), and smoking. Negative hypercoag wiorkup sept 2011 (post rivoroxaban)   #Tobacco Abuse  - since age 60, 50-15ppd, in past 3ppd  - never started chantix 2011   OV Feb 26, 2011: Followup PE/DVT and Smoking. He has almost completed 2 years of anticoagulation except for 1-2 month gap during switch from rivoroxaban to coumadin and discussions that followed. Per hx and chart review show therapeutic INR at coumadin clinic. He has adverse effects with coumadin though prefers rivoroxaban which we had calculated was going to be way more expensive for him. He has gained some weight. He is not exercising as much as he used to. Still has mild dyspnea for soccer games and jogging runs; never used to have it pre-PE but has it post-PE but stable since onset. He feels this is due to deconditioning. Relieved by rest.  Still smokkes - never started chantix. Understands need to quit but says he loves cigarettes and unllikely to quit.  Denies chest pain, hemoptysis, edema, cough, syncope.  REC #PUlmonary EMbolism  - you have completed nearly 2 years of anticoagulation this mid-feb 2013  - continue coumadin at current dose for INR > 2 till mid-feb 2014  - after that coumadin dose to be reduced for INR goal > 1.5 which I want you to take for another 1 year atleast  - this is to prevent risk for 2nd  clot which will mean coumadin for life - wil llet Tiffany Muse at coumadin clinic know about this plan #Shortness;  of breath  - this is likely due to lack of fitness - if this persists might need echo of the heart #Smoking  - this is a big risk factor for PE. Please quit #Health   - flu shot today #Followup  -  August 2013 or sooner if needed  OV 06/30/2012  Doing well. No complaints. SEdentary. Takng coumadin with compliance for goal INR > 1.5. He is sick and tired of going to coumadin clinic; says is depressing to see old sick people there and is lifestyle disruptive. He is open to taking xarelto or stoppin anticoagulation altogether; whatever I recommened. 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 Denies bleeding complications, edema or reso complaints. Normal value today  include d-dimer blood test and creatinine  In terms of smoking: says he kjnows risks of smoking but says he loves it and cannot quit. Wife also smokes   Past, Family, Social reviewed: no change since last visit   Review of Systems  Constitutional: Negative for fever and unexpected weight change.  HENT: Negative for ear pain, nosebleeds, congestion, sore throat, rhinorrhea, sneezing, trouble swallowing, dental problem, postnasal drip and sinus pressure.   Eyes: Negative for redness and itching.  Respiratory: Negative for cough,  chest tightness, shortness of breath and wheezing.   Cardiovascular: Negative for palpitations and leg swelling.  Gastrointestinal: Negative for nausea and vomiting.  Genitourinary: Negative for dysuria.  Musculoskeletal: Negative for joint swelling.  Skin: Negative for rash.  Neurological: Negative for headaches.  Hematological: Does not bruise/bleed easily.  Psychiatric/Behavioral: Negative for dysphoric mood. The patient is not nervous/anxious.        Objective:   Physical Exam  Nursing note and vitals reviewed. Constitutional: He  is oriented to person, place, and time. He appears well-developed and well-nourished. No distress.  HENT:  Head: Normocephalic and atraumatic.  Right Ear: External ear normal.  Left Ear: External ear normal.  Mouth/Throat: Oropharynx is clear and moist. No oropharyngeal exudate.  Eyes: Conjunctivae and EOM are normal. Pupils are equal, round, and reactive to light. Right eye exhibits no discharge. Left eye exhibits no discharge. No scleral icterus.  Neck: Normal range of motion. Neck supple. No JVD present. No tracheal deviation present. No thyromegaly present.  Cardiovascular: Normal rate, regular rhythm and intact distal pulses.  Exam reveals no gallop and no friction rub.   No murmur heard. Pulmonary/Chest: Effort normal and breath sounds normal. No respiratory distress. He has no wheezes. He has no rales. He exhibits no tenderness.  Abdominal: Soft. Bowel sounds are normal. He exhibits no distension and no mass. There is no tenderness. There is no rebound and no guarding.  Musculoskeletal: Normal range of motion. He exhibits no edema and no tenderness.  Lymphadenopathy:    He has no cervical adenopathy.  Neurological: He is alert and oriented to person, place, and time. He has normal reflexes. No cranial nerve deficit. Coordination normal.  Skin: Skin is warm and dry. No rash noted. He is not diaphoretic. No erythema. No pallor.  Psychiatric: He has a normal mood and affect. His behavior is normal. Judgment and thought content normal.          Assessment & Plan:

## 2012-06-30 NOTE — Patient Instructions (Addendum)
#  Pulmonary Embolism Glad you are doing well I understand you are tired of gettnig INR checked and going to coumadin clinic Therefore, stop coumadin but instead take xarelto 10mg  preventative dose  -with xarelto no monitoring which is an advantage  - with xarelto bleeding can be tough to control which is a disadvantage but bleeding risk is lower at this 10mg  dose  - for any doctor procedures while on xarelto you need to be off xarelto for 24-48 hours Do blood work today to make sure is safe to take xarelto  - do d-dimer and bmet REturn to see me in 9 months to decide on further treatment  #SMoking  - need to quit, risk factor for PE

## 2012-06-30 NOTE — Telephone Encounter (Signed)
Please let him know that blood work is normal. He should start xarelto 10 mg per day preventative dose. I will see him back in 9 months with the intention of stopping xarelto that time.  Please ensure followup appointment   Dr. Kalman Shan, M.D., [facility].C.P Pulmonary and Critical Care Medicine Staff Physician [place] [facility] [place] [facility] Pager: [phone], If no answer or between  15:00h - 7:00h: call [phone]  06/30/2012 10:30 PM

## 2012-07-01 ENCOUNTER — Ambulatory Visit: Payer: Self-pay | Admitting: Cardiology

## 2012-07-05 ENCOUNTER — Telehealth: Payer: Self-pay | Admitting: Internal Medicine

## 2012-07-05 NOTE — Telephone Encounter (Signed)
For Marvin Escobar,  Please ensure that patient is no longer taking Coumadin but he will be on xarelto 10 mg once daily for the next one year as a preventative dose  Dr. Kalman Shan, M.D., [facility].C.P Pulmonary and Critical Care Medicine Staff Physician [place] [facility] [place] [facility] Pager: [phone], If no answer or between  15:00h - 7:00h: call [phone]  07/05/2012 10:24 PM

## 2012-07-05 NOTE — Assessment & Plan Note (Signed)
Status post full anticoagulations January 2011 to FEb 2013; 2 years office for 6 months was on xarelto as part of open label Jaquelyn Bitter trial. This was followed by anticoagulations for INR goal greater than 1.25 March 2011 through May 2014. Currently doing well with normal d-dimer and creatinine. We again discussed the risk of recurrence and decided to do 1 more year of anticoagulation at subtherapeutic doses are preventative doses but will do this with xarelto  PLAN #Pulmonary Embolism Glad you are doing well I understand you are tired of gettnig INR checked and going to coumadin clinic Therefore, stop coumadin but instead take xarelto 10mg  preventative dose  -with xarelto no monitoring which is an advantage  - with xarelto bleeding can be tough to control which is a disadvantage but bleeding risk is lower at this 10mg  dose  - for any doctor procedures while on xarelto you need to be off xarelto for 24-48 hours Do blood work today to make sure is safe to take xarelto  - do d-dimer and bmet REturn to see me in 9 months to decide on further treatment  #SMoking  - need to quit, risk factor for PE   > 50% of this > 25 min visit spent in face to face counseling (15 min visit converted to 25 min)

## 2012-07-05 NOTE — Assessment & Plan Note (Signed)
Counseled him for 3 minutes face-to-face with the hazards of smoking. He acknowledges the hazards of smoking including smoking being a risk factor for pulmonary embolism. However he says he loves smoking and so does his wife and he says he will never quit smoking

## 2012-07-07 MED ORDER — RIVAROXABAN 10 MG PO TABS: 10.0000 mg | ORAL_TABLET | Freq: Every day | ORAL | Status: DC

## 2012-07-07 NOTE — Telephone Encounter (Signed)
Called spoke with pt.  Pt is still on Warfarin at the present.  Does not have a rx for Xarelto 10mg  tablets.  Advised pt Dr Marchelle Gearing wanted me to facilitate transitioning him off Coumadin and onto Xarelto.  Advised pt I would sent rx for Xarelto to [facility] pharmacy in [place] today.  Requested pt pick up rx before discontinuing Coumadin to make sure it is affordable. Pt is in [place] at present will return to [facility] on Friday and will pick up Xarelto.  Advised pt to continue on Coumadin and do not start Xarelto until he calls and I can further instruct him as to when to stop Coumadin.  Pt must have INR < 3.0 to start Xarelto.  We will have pt hold Coumadin x 2 doses then bring into Coumadin clinic for INR check prior to starting him on Xarelto.  Pt verbalizes understanding.  Pt will call us once he is back in town and has picked up Xarelto rx. Will await call back from pt.

## 2012-07-08 NOTE — Telephone Encounter (Signed)
LMTCBx1. Looks like pt has already started [employer], rx sent by coumadin clinic. Carron Curie, CMA

## 2012-07-09 NOTE — Telephone Encounter (Signed)
Pt started xarelto, reminder is placed for 9 month follow-up.Carron Curie, CMA

## 2012-07-15 NOTE — Telephone Encounter (Signed)
Spoke with pt, he is picking up Xarelto at pharmacy this evening.  Made OV for Coumadin check for tomorrow am 07/16/12 at 8:15am. If INR <3.0 pt can start Xarelto 10mg  QD per Dr Marchelle Gearing.

## 2012-07-16 ENCOUNTER — Ambulatory Visit (INDEPENDENT_AMBULATORY_CARE_PROVIDER_SITE_OTHER): Payer: PRIVATE HEALTH INSURANCE

## 2012-07-16 DIAGNOSIS — Z7901 Long term (current) use of anticoagulants: Secondary | ICD-10-CM

## 2012-07-16 DIAGNOSIS — Z86711 Personal history of pulmonary embolism: Secondary | ICD-10-CM

## 2012-07-16 NOTE — Telephone Encounter (Signed)
Pt seen in Coumadin Clinic this am.  INR 1.5 advised pt to stop Coumadin and start Xarelto 10mg  QD with dinner.  Educated pt on Xarelto, rx sent to pharmacy pt picking up at pharmacy today and will start this pm.

## 2012-07-16 NOTE — Patient Instructions (Signed)
A full discussion of the nature of anticoagulants has been carried out.  A benefit/risk analysis has been presented to the patient, so that they understand the justification for choosing anticoagulation with Xarelto at this time.  The need for compliance is stressed.  Pt is aware to take the medication once daily with the largest meal of the day.  Side effects of potential bleeding are discussed, including unusual colored urine or stools, coughing up blood or coffee ground emesis, nose bleeds or serious fall or head trauma.  Discussed signs and symptoms of stroke. The patient should avoid any OTC items containing aspirin or ibuprofen.  Avoid alcohol consumption.   Call if any signs of abnormal bleeding.  Discussed financial obligations and resolved any difficulty in obtaining medication.  Next lab test test in 6 months.   Stop Coumadin today, start Xarelto 10mg  once daily in the pm with dinner.

## 2012-07-16 NOTE — Telephone Encounter (Signed)
Thanks a lot.   Dr. Kalman Shan, M.D., [facility].C.P Pulmonary and Critical Care Medicine Staff Physician [place] [facility] [place] [facility] Pager: [phone], If no answer or between  15:00h - 7:00h: call [phone]  07/16/2012 9:08 AM

## 2012-08-27 ENCOUNTER — Telehealth: Payer: Self-pay | Admitting: Internal Medicine

## 2012-08-27 NOTE — Telephone Encounter (Signed)
Hey Dr Marchelle Gearing I discussed cost of Xarelto before switching this pt from Coumadin.  Pt stated he could afford even if insurance did not cover.  He was willing to pay retail price out of pocket if necessary.  Compliance should not be an issue regardless of prior auth approval or denial. Thanks.

## 2012-08-27 NOTE — Telephone Encounter (Signed)
Hi [name]  I got a Prior Auth for zxarelto for this patient. Did to best of my availabilty. Could you please keep track. If expensive or he is not taking it, we have to put him back on coumadin.   Thanks  Dr. Kalman Shan, M.D., [facility].C.P Pulmonary and Critical Care Medicine Staff Physician [place] [facility] [place] [facility] Pager: [phone], If no answer or between  15:00h - 7:00h: call [phone]  08/27/2012 5:03 AM

## 2012-08-28 NOTE — Telephone Encounter (Signed)
Ok awesome. We are only doing preventative dose 10mg .    Thanks  Dr. Kalman Shan, M.D., [facility].C.P Pulmonary and Critical Care Medicine Staff Physician [place] [facility] [place] [facility] Critical [facility] Pager: [phone], If no answer or between  15:00h - 7:00h: call [phone]  08/28/2012 7:23 AM

## 2012-08-30 NOTE — Telephone Encounter (Signed)
Noted  

## 2013-06-24 ENCOUNTER — Other Ambulatory Visit: Payer: Self-pay | Admitting: Internal Medicine

## 2013-07-15 ENCOUNTER — Ambulatory Visit: Payer: PRIVATE HEALTH INSURANCE | Admitting: Cardiology

## 2013-07-17 ENCOUNTER — Other Ambulatory Visit: Payer: Self-pay | Admitting: Internal Medicine

## 2013-07-27 ENCOUNTER — Encounter: Payer: Self-pay | Admitting: Cardiology

## 2013-08-18 ENCOUNTER — Other Ambulatory Visit: Payer: Self-pay | Admitting: Internal Medicine

## 2013-09-20 ENCOUNTER — Ambulatory Visit (INDEPENDENT_AMBULATORY_CARE_PROVIDER_SITE_OTHER): Payer: PRIVATE HEALTH INSURANCE | Admitting: Cardiology

## 2013-09-20 ENCOUNTER — Encounter: Payer: Self-pay | Admitting: *Deleted

## 2013-09-20 ENCOUNTER — Encounter: Payer: Self-pay | Admitting: Cardiology

## 2013-09-20 VITALS — BP 138/88 | HR 63 | Ht 72.0 in | Wt 217.0 lb

## 2013-09-20 DIAGNOSIS — Z7901 Long term (current) use of anticoagulants: Secondary | ICD-10-CM

## 2013-09-20 DIAGNOSIS — R9431 Abnormal electrocardiogram [ECG] [EKG]: Secondary | ICD-10-CM

## 2013-09-20 DIAGNOSIS — I498 Other specified cardiac arrhythmias: Secondary | ICD-10-CM

## 2013-09-20 DIAGNOSIS — F17200 Nicotine dependence, unspecified, uncomplicated: Secondary | ICD-10-CM

## 2013-09-20 DIAGNOSIS — Z86711 Personal history of pulmonary embolism: Secondary | ICD-10-CM

## 2013-09-20 DIAGNOSIS — R001 Bradycardia, unspecified: Secondary | ICD-10-CM

## 2013-09-20 NOTE — Patient Instructions (Signed)
The current medical regimen is effective;  continue present plan and medications.  Your physician has requested that you have an echocardiogram. Echocardiography is a painless test that uses sound waves to create images of your heart. It provides your doctor with information about the size and shape of your heart and how well your heart's chambers and valves are working. This procedure takes approximately one hour. There are no restrictions for this procedure.  Further follow up will be based on these results.

## 2013-09-20 NOTE — Progress Notes (Signed)
[address] [place], [place]  [address] Phone: [phone]) [phone] Fax:  [phone]) [phone]  Date:  09/20/2013   ID:  Marvin Escobar, DOB [birth year], MRN [id number]  PCP:  Kalman ShanAMASWAMY,MURALI, MD   History of Present Illness: Marvin SofiaSitki Jaycox is a 38 y.o. male here at the request of Dr. Alric RanSteve Hatcher for the evaluation of bradycardia. During his dental procedure including IV sedation, he was noted to have decreased pulse of 45 beats per minute with pronounced T waves on 3 L EKG telemetry. At that time, reportedly asymptomatic. He has not had any syncope, chest pain, fevers. No early family history of sudden cardiac death. In 2011 he had large bilateral pulmonary emboli, DVT and has been treated with anticoagulation since, managed by pulmonary medicine. Now he is on Xarelto.  His risk factors for pulmonary embolism include kick injury to his left calf weeks before his pulmonary embolus and, 6 hour car trip to a [place] but he took breaks, grandfather died in [place] from PE possibly, smoking. He had a negative hypercoagulable workup in September of 2011.   Wt Readings from Last 3 Encounters:  09/20/13 217 lb (98.431 kg)  06/30/12 221 lb (100.245 kg)  02/26/11 224 lb (101.606 kg)     Past Medical History  Diagnosis Date  . Tobacco abuse   . Pulmonary embolism     No past surgical history on file.  Current Outpatient Prescriptions  Medication Sig Dispense Refill  . XARELTO 10 MG TABS tablet TAKE ONE TABLET BY MOUTH ONE TIME DAILY ** MUST MAKE OFFICE VISIT BEFORE MORE REFILLS**   30 tablet  0  . XARELTO 10 MG TABS tablet TAKE ONE TABLET BY MOUTH ONE TIME DAILY ** MUST MAKE OFFICE VISIT BEFORE MORE REFILLS**   30 tablet  0   No current facility-administered medications for this visit.    Allergies:   No Known Allergies  Social History:  The patient  reports that he has been smoking.  He has never used smokeless tobacco.   Family History  Problem Relation Age of Onset  .  Hypertension Mother   . Angina Father     ROS:  Please see the history of present illness.   Denies any fevers, chills, orthopnea, PND.   All other systems reviewed and negative.   PHYSICAL EXAM: VS:  BP 138/88  Pulse 63  Ht 6' (1.829 m)  Wt 217 lb (98.431 kg)  BMI 29.42 kg/m2 Well nourished, well developed, in no acute distress HEENT: normal, [place]/AT, EOMI Neck: no JVD, normal carotid upstroke, no bruit Cardiac:  normal S1, S2; RRR; no murmur Lungs:  clear to auscultation bilaterally, no wheezing, rhonchi or rales Abd: soft, nontender, no hepatomegaly, no bruits Ext: no edema, 2+ distal pulses Skin: warm and dry GU: deferred Neuro: no focal abnormalities noted, AAO x 3  EKG:  09/20/13 - normal sinus rhythm, heart rate 63, mildly peaking T waves in V3, subtle J-point elevation in 2, 3, aVF.  Labs: 06/30/12-sodium 138, potassium 4.4, creatinine 1.2  ASSESSMENT AND PLAN:  1. Bradycardia-currently doing well. On no AV nodal blocking agents. Possible increase in vagal tone during dental procedure may have precipitated bradycardia. I will check an echocardiogram to ensure proper structure and function of his heart. Since he is asymptomatic at this time, I will forego monitoring. We could consider in the future 24-hour Holter monitor. 2. T-wave peaking-mildly pronounced on ECG. This could be a normal variant. Prior lab work  demonstrated normal potassium. Nonetheless, checking echocardiogram. 3. Tobacco use-counseled on tobacco cessation. Reduce his risk of stroke, heart attack, future pulmonary embolism. 4. History of bilateral pulmonary embolism/DVT-managed per Dr. Marchelle Gearing.   Signed, Donato Schultz, MD [facility]  09/20/2013 4:39 PM

## 2013-09-20 NOTE — Addendum Note (Signed)
Addended by: Arcola JanskyOOK, Jolita Haefner M on: 09/20/2013 05:23 PM   Modules accepted: Orders

## 2013-09-28 ENCOUNTER — Ambulatory Visit (HOSPITAL_COMMUNITY): Payer: 59 | Attending: Cardiovascular Disease | Admitting: Radiology

## 2013-09-28 DIAGNOSIS — F17200 Nicotine dependence, unspecified, uncomplicated: Secondary | ICD-10-CM | POA: Diagnosis not present

## 2013-09-28 DIAGNOSIS — R9431 Abnormal electrocardiogram [ECG] [EKG]: Secondary | ICD-10-CM | POA: Insufficient documentation

## 2013-09-28 DIAGNOSIS — R0989 Other specified symptoms and signs involving the circulatory and respiratory systems: Secondary | ICD-10-CM | POA: Insufficient documentation

## 2013-09-28 DIAGNOSIS — R0609 Other forms of dyspnea: Secondary | ICD-10-CM | POA: Insufficient documentation

## 2013-09-28 NOTE — Progress Notes (Signed)
Echocardiogram performed.  

## 2013-10-03 ENCOUNTER — Encounter: Payer: Self-pay | Admitting: Internal Medicine

## 2013-10-03 ENCOUNTER — Ambulatory Visit (INDEPENDENT_AMBULATORY_CARE_PROVIDER_SITE_OTHER): Payer: PRIVATE HEALTH INSURANCE | Admitting: Internal Medicine

## 2013-10-03 VITALS — BP 118/72 | HR 58 | Ht 72.0 in | Wt 219.0 lb

## 2013-10-03 DIAGNOSIS — F17200 Nicotine dependence, unspecified, uncomplicated: Secondary | ICD-10-CM

## 2013-10-03 DIAGNOSIS — I2699 Other pulmonary embolism without acute cor pulmonale: Secondary | ICD-10-CM

## 2013-10-03 NOTE — Progress Notes (Signed)
Subjective:    Patient ID: Marvin Escobar, male    DOB: 1976-02-04, 38 y.o.   MRN: [id number]  HPI   HPI Problem List # . 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 Low PESI score - Class 1 with normal soidium level.  - Rx 6 month RIVOROXABAN on the [facility] Open Labelled trial -ending August 2011 -  Started coumadin mid-end Oct 2011 goal INR >2 for 1-2 years, then INR goal > 1.5 another 1-2 years  - Risk factors, kick injyr on the left calf weeks before PE, 6h car trip to [place] but took breaks,  grandfather died in [place] from PE possibly (details NA), and smoking. Negative hypercoag wiorkup sept 2011 (post rivoroxaban)   #Tobacco Abuse  - since age 62, 68-15ppd, in past 3ppd  - never started chantix 2011   OV Feb 26, 2011: Followup PE/DVT and Smoking. He has almost completed 2 years of anticoagulation except for 1-2 month gap during switch from rivoroxaban to coumadin and discussions that followed. Per hx and chart review show therapeutic INR at coumadin clinic. He has adverse effects with coumadin though prefers rivoroxaban which we had calculated was going to be way more expensive for him. He has gained some weight. He is not exercising as much as he used to. Still has mild dyspnea for soccer games and jogging runs; never used to have it pre-PE but has it post-PE but stable since onset. He feels this is due to deconditioning. Relieved by rest.  Still smokkes - never started chantix. Understands need to quit but says he loves cigarettes and unllikely to quit.  Denies chest pain, hemoptysis, edema, cough, syncope.  REC #PUlmonary EMbolism  - you have completed nearly 2 years of anticoagulation this mid-feb 2013  - continue coumadin at current dose for INR > 2 till mid-feb 2014  - after that coumadin dose to be reduced for INR goal > 1.5 which I want you to take for another 1 year atleast  - this is to prevent risk for 2nd  clot which will mean coumadin for life - wil llet Tiffany Muse at coumadin clinic know about this plan #Shortness;  of breath  - this is likely due to lack of fitness - if this persists might need echo of the heart #Smoking  - this is a big risk factor for PE. Please quit #Health   - flu shot today #Followup  -  August 2013 or sooner if needed  OV 06/30/2012  Doing well. No complaints. SEdentary. Takng coumadin with compliance for goal INR > 1.5. He is sick and tired of going to coumadin clinic; says is depressing to see old sick people there and is lifestyle disruptive. He is open to taking xarelto or stoppin anticoagulation altogether; whatever I recommened. 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 Denies bleeding complications, edema or reso complaints. Normal value today  include d-dimer blood test and creatinine  In terms of smoking: says he kjnows risks of smoking but says he loves it and cannot quit. Wife also smokes   Past, Family, Social reviewed: no change since last visit  #Pulmonary Embolism Glad you are doing well I understand you are tired of gettnig INR checked and going to coumadin clinic Therefore, stop coumadin but instead take xarelto 10mg  preventative dose  -with xarelto no monitoring which is an advantage  - with xarelto bleeding can  be tough to control which is a disadvantage but bleeding risk is lower at this 10mg  dose  - for any doctor procedures while on xarelto you need to be off xarelto for 24-48 hours Do blood work today to make sure is safe to take xarelto  - do d-dimer and bmet -> NORMAL -> Take xarelto 10mg  per day preventative dose x 1 year and then consider stopping REturn to see me in 12 months to decide on further treatment  #SMoking  - need to quit, risk factor for PE   OV 10/03/2013  Chief Complaint  Patient presents with  . Follow-up    Pt states his breathing is doing well. Pt denies SOB,  cough and CP/tightness. No new complaints. Pt here to renew Xarelto.    More than 4 years since first blood clot. 1 year ago d-dimer normal; suggesting very low risk for recurrence. However, I maintained him on low dose 10mg  xarelto. Returns now for fu. Doing well. No DVT. No PE. Not doing exercise but been active. Still smoking; will not quit. Wants to come off xarelto.   Past, Family, Social reviewed: no change since last visit  Review of Systems  Constitutional: Negative for fever and unexpected weight change.  HENT: Negative for congestion, dental problem, ear pain, nosebleeds, postnasal drip, rhinorrhea, sinus pressure, sneezing, sore throat and trouble swallowing.   Eyes: Negative for redness and itching.  Respiratory: Negative for cough, chest tightness, shortness of breath and wheezing.   Cardiovascular: Negative for palpitations and leg swelling.  Gastrointestinal: Negative for nausea and vomiting.  Genitourinary: Negative for dysuria.  Musculoskeletal: Negative for joint swelling.  Skin: Negative for rash.  Neurological: Negative for headaches.  Hematological: Does not bruise/bleed easily.  Psychiatric/Behavioral: Negative for dysphoric mood. The patient is not nervous/anxious.        Objective:   Physical Exam  Nursing note and vitals reviewed. Constitutional: He is oriented to person, place, and time. He appears well-developed and well-nourished. No distress.  HENT:  Head: Normocephalic and atraumatic.  Right Ear: External ear normal.  Left Ear: External ear normal.  Mouth/Throat: Oropharynx is clear and moist. No oropharyngeal exudate.  Eyes: Conjunctivae and EOM are normal. Pupils are equal, round, and reactive to light. Right eye exhibits no discharge. Left eye exhibits no discharge. No scleral icterus.  Neck: Normal range of motion. Neck supple. No JVD present. No tracheal deviation present. No thyromegaly present.  Cardiovascular: Normal rate, regular rhythm and  intact distal pulses.  Exam reveals no gallop and no friction rub.   No murmur heard. Pulmonary/Chest: Effort normal and breath sounds normal. No respiratory distress. He has no wheezes. He has no rales. He exhibits no tenderness.  Abdominal: Soft. Bowel sounds are normal. He exhibits no distension and no mass. There is no tenderness. There is no rebound and no guarding.  Musculoskeletal: Normal range of motion. He exhibits no edema and no tenderness.  Lymphadenopathy:    He has no cervical adenopathy.  Neurological: He is alert and oriented to person, place, and time. He has normal reflexes. No cranial nerve deficit. Coordination normal.  Skin: Skin is warm and dry. No rash noted. He is not diaphoretic. No erythema. No pallor.  Psychiatric: He has a normal mood and affect. His behavior is normal. Judgment and thought content normal.    Filed Vitals:   10/03/13 0905  BP: 118/72  Pulse: 58  Height: 6' (1.829 m)  Weight: 219 lb (99.338 kg)  SpO2: 100%  Assessment & Plan:  #Pulmonary Embolism Glad you are doing well; 4 year since your first clot wihtout recurrence Stop 10mg  xarelto dose Take baby aspirin 81mg  per day for another 1 year atleast Ensure blood clot prevention during long travels  #SMoking  - need to quit, risk factor for PE  #Followup  - as needed

## 2013-10-03 NOTE — Assessment & Plan Note (Signed)
Advised to quit.  

## 2013-10-03 NOTE — Assessment & Plan Note (Signed)
#  Pulmonary Embolism Glad you are doing well; 4 year since your first clot wihtout recurrence Stop 10mg  xarelto dose Take baby aspirin 81mg  per day for another 1 year atleast Ensure blood clot prevention during long travels  #SMoking  - need to quit, risk factor for PE  #Followup  - as needed

## 2013-10-03 NOTE — Patient Instructions (Addendum)
#  Pulmonary Embolism Glad you are doing well; 4 year since your first clot Stop 10mg  xarelto dose Take baby aspirin 81mg  per day for another 1 year atleast Ensure blood clot prevention during long travels  #SMoking  - need to quit, risk factor for PE  #Followup  - as needed

## 2014-11-14 ENCOUNTER — Other Ambulatory Visit: Payer: Self-pay

## 2014-11-14 ENCOUNTER — Ambulatory Visit (INDEPENDENT_AMBULATORY_CARE_PROVIDER_SITE_OTHER): Payer: Self-pay | Admitting: Internal Medicine

## 2014-11-14 ENCOUNTER — Encounter: Payer: Self-pay | Admitting: Internal Medicine

## 2014-11-14 VITALS — BP 134/82 | HR 65 | Temp 98.4°F | Ht 72.0 in | Wt 225.0 lb

## 2014-11-14 DIAGNOSIS — R0602 Shortness of breath: Secondary | ICD-10-CM

## 2014-11-14 DIAGNOSIS — Z23 Encounter for immunization: Secondary | ICD-10-CM

## 2014-11-14 DIAGNOSIS — Z86711 Personal history of pulmonary embolism: Secondary | ICD-10-CM

## 2014-11-14 LAB — D-DIMER, QUANTITATIVE (NOT AT ARMC): D DIMER QUANT: 1.11 ug{FEU}/mL — AB (ref 0.00–0.48)

## 2014-11-14 NOTE — Patient Instructions (Signed)
ICD-9-CM ICD-10-CM   1. History of pulmonary embolism V12.55 Z86.711   2. Shortness of breath 786.05 R06.02    Most likely you are out of shape and therefore you are short of breath but we will rule out medical causes given history of prior blood clot and history of current smoking  Plan Continue baby aspirin daily Flu shot today 11/14/2014 Do d-dimer blood test  If this is abnormal we will proceed to VQ scan  If this is normal then we will proceed to pulmonary function test  Follow-up - We will stay on touch by phone

## 2014-11-14 NOTE — Progress Notes (Signed)
Subjective:    Patient ID: Marvin Escobar, male    DOB: August 23, 1975, 39 y.o.   MRN: [id number]  HPI   . 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 Low PESI score - Class 1 with normal soidium level.  - Rx 6 month RIVOROXABAN on the [facility] Open Labelled trial -ending August 2011 -  Started coumadin mid-end Oct 2011 goal INR >2 for 1-2 years, then INR goal > 1.5 another 1-2 years - Did low-dose 10 mg per day Xarelto May 2014 through August 2015 - Baby aspirin from August 2015  - Risk factors, kick injyr on the left calf weeks before PE, 6h car trip to [place] but took breaks,  grandfather died in [place] Esc[place] from PE possibly (details NA), and smoking. Negative hypercoag wiorkup sept 2011 (post rivoroxaban) -    #Tobacco Abuse  - since age 19, 80-15ppd, in past 3ppd  - never started chantix 2011   OV 11/14/2014 Chief Complaint  Patient presents with  . Follow-up    1 yr follow up - still taking ASA  occasionally.  denies any dyspnea, CP, cough, wheezing.  still smoking 1ppd    Follow-up for the above. He says that he is mostly compliant with his baby aspirin. In the interim he has gained weight. He continues to smoke. 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 He is not actively exercising anymore. He is not playing soccer. Reports new onset of dyspnea for the last 1 month intermittent. It is mild. Notices that when he tries to exercise. Recently bought a video to exercise and he gets dyspneic. Says the dyspnea feels more like deconditioning as a posterior prior pulmonary embolism. Nevertheless he is worried about recurrence pulmonary embolism and also impact of smoking on his lungs. He wants this worked up. He has not had flu shot but will have it today. This was associated chest pain or wheezing or hemoptysis or leg swelling.   Current outpatient prescriptions:  .  aspirin 81 MG tablet, Take 81 mg by  mouth daily., Disp: , Rfl:   Immunization History  Administered Date(s) Administered  . Influenza Split 02/26/2011  . Influenza Whole 11/20/2008  . Influenza-Unspecified 12/23/2013     Review of Systems According to  History of present illness    Objective:   Physical Exam  Constitutional: He is oriented to person, place, and time. He appears well-developed and well-nourished. No distress.  Has gained weight  HENT:  Head: Normocephalic and atraumatic.  Right Ear: External ear normal.  Left Ear: External ear normal.  Mouth/Throat: Oropharynx is clear and moist. No oropharyngeal exudate.  Smells of tobacco  Eyes: Conjunctivae and EOM are normal. Pupils are equal, round, and reactive to light. Right eye exhibits no discharge. Left eye exhibits no discharge. No scleral icterus.  Neck: Normal range of motion. Neck supple. No JVD present. No tracheal deviation present. No thyromegaly present.  Cardiovascular: Normal rate, regular rhythm and intact distal pulses.  Exam reveals no gallop and no friction rub.   No murmur heard. Pulmonary/Chest: Effort normal and breath sounds normal. No respiratory distress. He has no wheezes. He has no rales. He exhibits no tenderness.  Abdominal: Soft. Bowel sounds are normal. He exhibits no distension and no mass. There is no tenderness. There is no rebound and no guarding.  Musculoskeletal: Normal range of motion. He exhibits no edema or tenderness.  Lymphadenopathy:  He has no cervical adenopathy.  Neurological: He is alert and oriented to person, place, and time. He has normal reflexes. No cranial nerve deficit. Coordination normal.  Skin: Skin is warm and dry. No rash noted. He is not diaphoretic. No erythema. No pallor.  Psychiatric: He has a normal mood and affect. His behavior is normal. Judgment and thought content normal.  Nursing note and vitals reviewed.         Assessment & Plan:     ICD-9-CM ICD-10-CM   1. History of pulmonary  embolism V12.55 Z86.711 D-Dimer, Quantitative  2. Shortness of breath 786.05 R06.02 D-Dimer, Quantitative    Most likely you are out of shape and therefore you are short of breath but we will rule out medical causes given history of prior blood clot and history of current smoking  Plan Quit smoking Continue baby aspirin daily Flu shot today 11/14/2014 Do d-dimer blood test  If this is abnormal we will proceed to VQ scan  If this is normal then we will proceed to pulmonary function test  Follow-up - We will stay on touch by phone  Dr. Kalman Shan, M.D., [facility].C.P Pulmonary and Critical Care Medicine Staff Physician [facility] [place] [facility] Pager: [phone], If no answer or between  15:00h - 7:00h: call [phone]  11/14/2014 10:01 AM

## 2014-11-15 ENCOUNTER — Telehealth: Payer: Self-pay | Admitting: Internal Medicine

## 2014-11-15 DIAGNOSIS — R0600 Dyspnea, unspecified: Secondary | ICD-10-CM

## 2014-11-15 DIAGNOSIS — Z86711 Personal history of pulmonary embolism: Secondary | ICD-10-CM

## 2014-11-15 DIAGNOSIS — R7989 Other specified abnormal findings of blood chemistry: Secondary | ICD-10-CM

## 2014-11-15 NOTE — Telephone Encounter (Signed)
Called pt and lmtcb x1 Will order VQ once we speak with pt

## 2014-11-15 NOTE — Telephone Encounter (Signed)
LEt Marvin Escobar know that D-dimer is HIGH Again compared to last year when normal. So, do VQ scan to rule out blood clot. Indication - dyspnea, high d-dimer and prior PE hx.IN comment write - rule out chronic PE

## 2014-11-16 ENCOUNTER — Ambulatory Visit (HOSPITAL_COMMUNITY): Payer: Self-pay

## 2014-11-16 ENCOUNTER — Other Ambulatory Visit: Payer: Self-pay | Admitting: Internal Medicine

## 2014-11-16 ENCOUNTER — Encounter (HOSPITAL_COMMUNITY): Admission: RE | Admit: 2014-11-16 | Payer: Self-pay | Source: Ambulatory Visit

## 2014-11-16 DIAGNOSIS — R0600 Dyspnea, unspecified: Secondary | ICD-10-CM

## 2014-11-16 DIAGNOSIS — Z86711 Personal history of pulmonary embolism: Secondary | ICD-10-CM

## 2014-11-16 DIAGNOSIS — R7989 Other specified abnormal findings of blood chemistry: Secondary | ICD-10-CM

## 2014-11-16 NOTE — Telephone Encounter (Signed)
Called and spoke with pt and informed of elevated d-dimer and the need for VQ scan to rule out PE per Dr Mickel Crow Pt voiced understanding of rec  Order placed electronically for VQ scan  Nothing further is needed at this time

## 2014-11-16 NOTE — Telephone Encounter (Signed)
Called both #'s and LMTCB x2

## 2014-11-16 NOTE — Telephone Encounter (Signed)
Pt returning call and can be reached @ [phone].Caren Griffins

## 2014-11-17 ENCOUNTER — Ambulatory Visit (HOSPITAL_COMMUNITY)
Admission: RE | Admit: 2014-11-17 | Discharge: 2014-11-17 | Disposition: A | Discharge status: Home / Self Care | Payer: Self-pay | Source: Ambulatory Visit | Attending: Internal Medicine | Admitting: Internal Medicine

## 2014-11-17 ENCOUNTER — Other Ambulatory Visit: Payer: Self-pay | Admitting: *Deleted

## 2014-11-17 ENCOUNTER — Encounter (HOSPITAL_COMMUNITY)
Admission: RE | Admit: 2014-11-17 | Discharge: 2014-11-17 | Disposition: A | Discharge status: Home / Self Care | Payer: Self-pay | Source: Ambulatory Visit | Attending: Internal Medicine | Admitting: Internal Medicine

## 2014-11-17 ENCOUNTER — Telehealth: Payer: Self-pay | Admitting: Internal Medicine

## 2014-11-17 DIAGNOSIS — R791 Abnormal coagulation profile: Secondary | ICD-10-CM | POA: Insufficient documentation

## 2014-11-17 DIAGNOSIS — R0602 Shortness of breath: Secondary | ICD-10-CM | POA: Insufficient documentation

## 2014-11-17 DIAGNOSIS — Z86711 Personal history of pulmonary embolism: Secondary | ICD-10-CM

## 2014-11-17 DIAGNOSIS — R0600 Dyspnea, unspecified: Secondary | ICD-10-CM

## 2014-11-17 DIAGNOSIS — R7989 Other specified abnormal findings of blood chemistry: Secondary | ICD-10-CM

## 2014-11-17 DIAGNOSIS — R079 Chest pain, unspecified: Secondary | ICD-10-CM | POA: Insufficient documentation

## 2014-11-17 IMAGING — DX DG CHEST 2V
2 series · 2 of 2 positions shown · non-contrast
Comparison: [DATE]

CLINICAL DATA: Dyspnea. Elevated D-dimer. Personal history of
pulmonary embolism.

EXAM:
CHEST  2 VIEW

[chest pa]
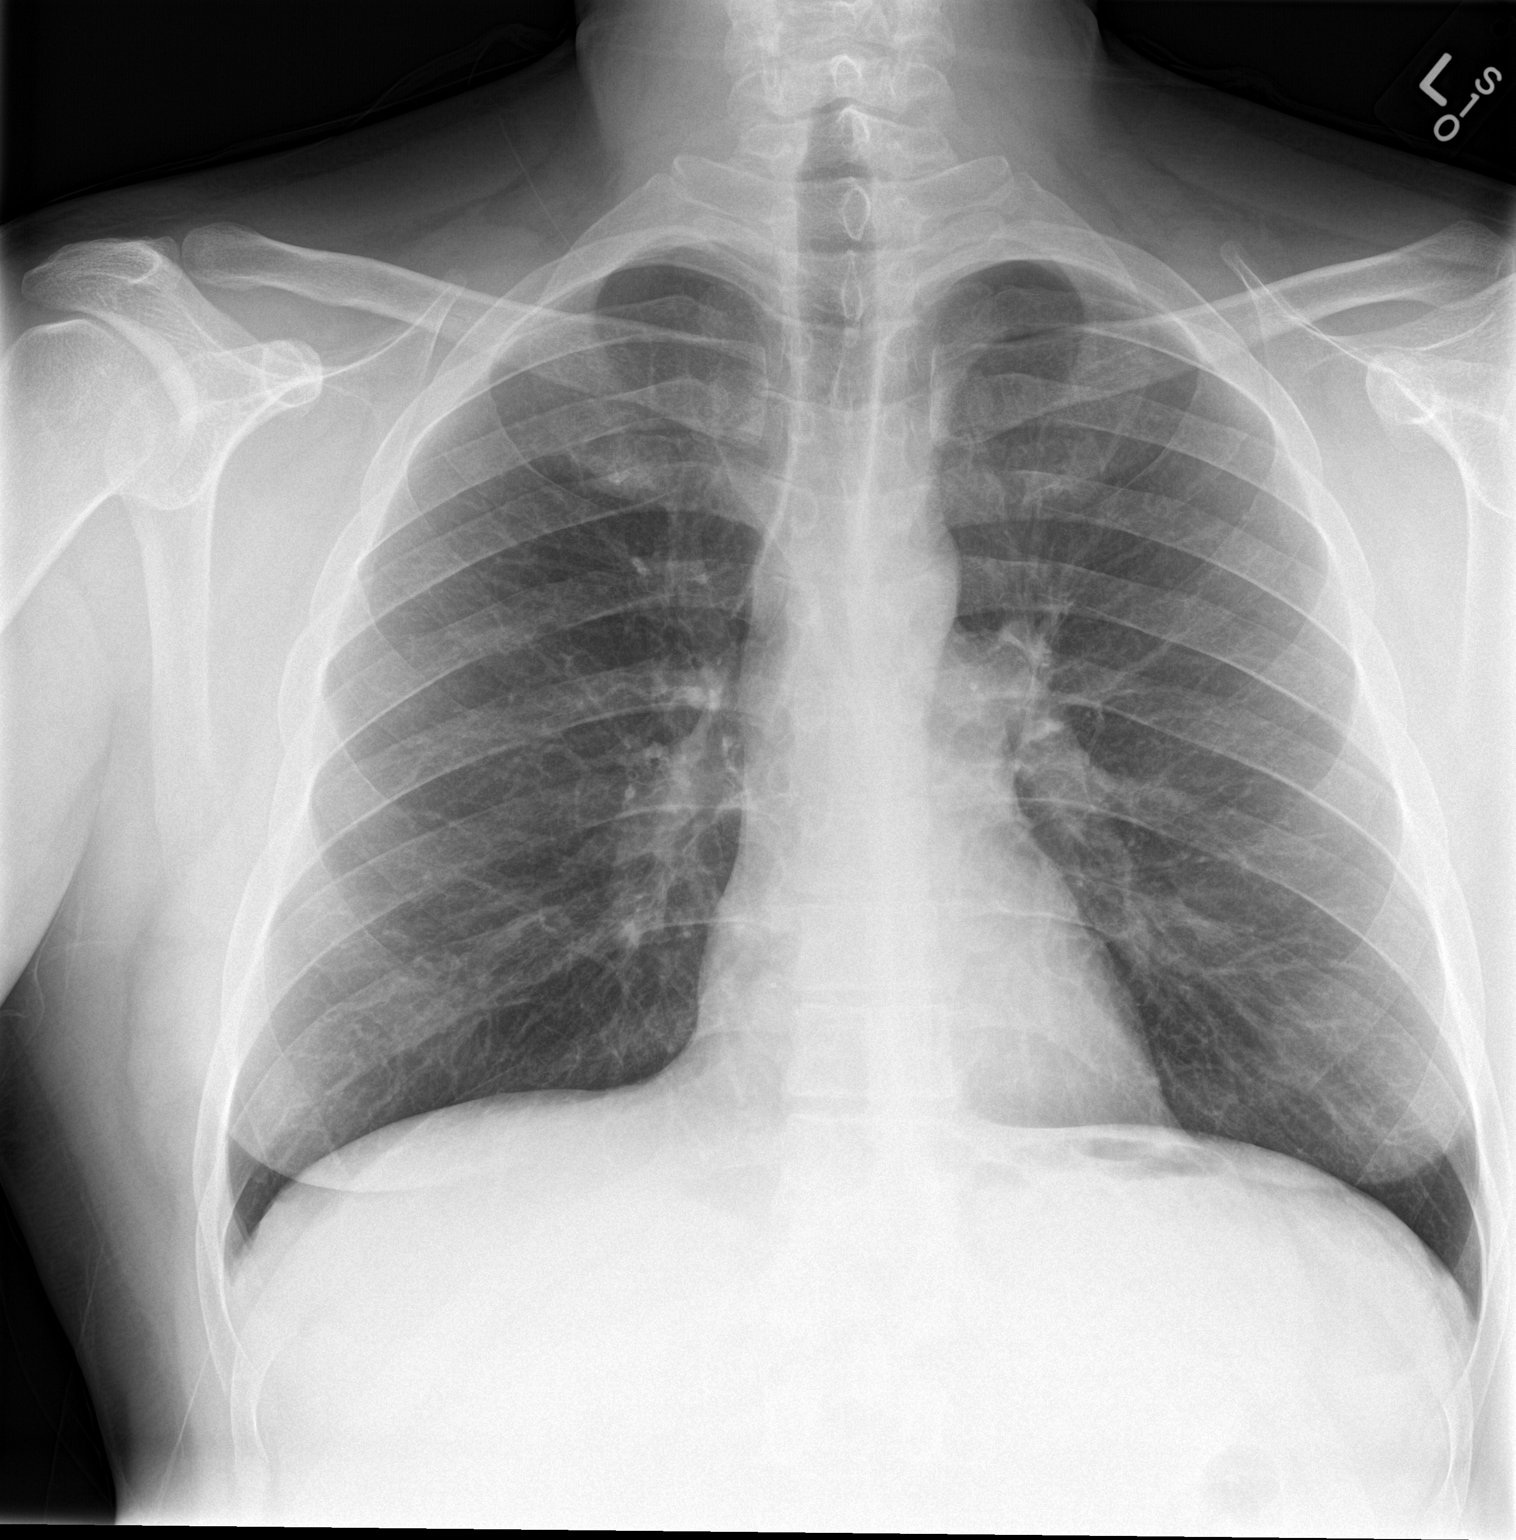

[chest lat]
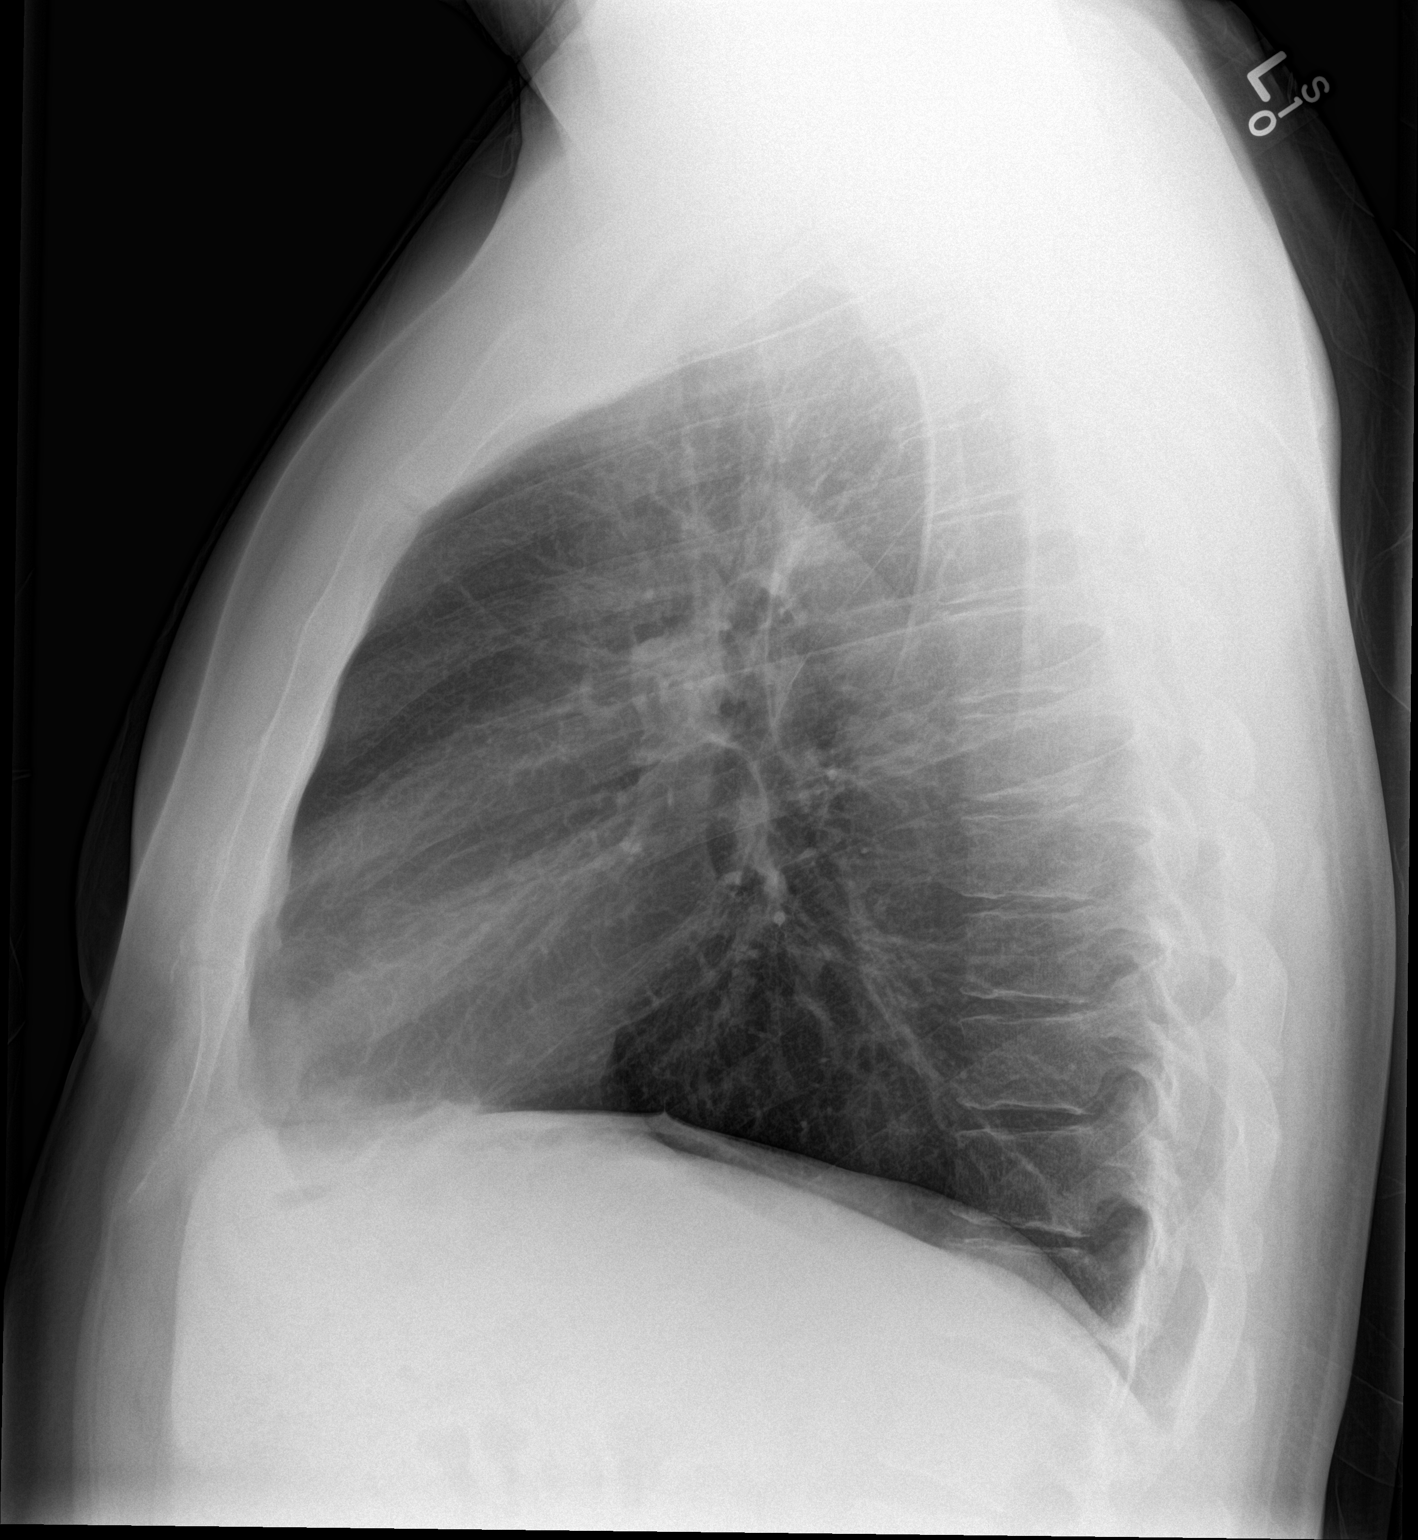

[2 of 2 positions shown; findings below may reference images not displayed]

FINDINGS: The heart size and mediastinal contours are within normal limits.
Both lungs are clear. The visualized skeletal structures are
unremarkable.
IMPRESSION: No active cardiopulmonary disease.

## 2014-11-17 MED ORDER — RIVAROXABAN (XARELTO) VTE STARTER PACK (15 & 20 MG): ORAL_TABLET | ORAL | Status: DC

## 2014-11-17 MED ORDER — TECHNETIUM TO 99M ALBUMIN AGGREGATED
6.0000 | Freq: Once | INTRAVENOUS | Status: AC | PRN
  Administered 2014-11-17: 5.95 via INTRAVENOUS

## 2014-11-17 MED ORDER — TECHNETIUM TC 99M DIETHYLENETRIAME-PENTAACETIC ACID: 40.0000 | Freq: Once | INTRAVENOUS | Status: DC | PRN

## 2014-11-17 NOTE — Telephone Encounter (Signed)
Called and left detailed message on voicemail advising patient to call to schedule 1 month follow up appointment with MR.

## 2014-11-17 NOTE — Telephone Encounter (Signed)
Discussed with Marvin Escobar as he was walking out of the radiology suite. 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 He is not aware of the diagnosis. He says that he is asymptomatic and does not want admission. I recommended Xarelto lifelong anticoagulation. Avoid trauma. Avoid heavy exertion. Advised him to go to ER if he is getting worse. He needs aside Xarelto today and I told him that  Plan Give him follow-up in 1 month

## 2014-11-17 NOTE — Telephone Encounter (Signed)
Called patient for Dr. Marchelle Gearing to speak to him urgently Dr. Marchelle Gearing discussing with patient  Per MR: Send in Xarelto start pack - no coupons available for Xarelto  To MR for documentation

## 2014-11-17 NOTE — Telephone Encounter (Signed)
Marvin Escobar from radiology Dr Francee Piccolo - multple PE on VQ scan. Please call patient and I want to talk him

## 2014-12-05 NOTE — Telephone Encounter (Signed)
Thanks and noted 

## 2014-12-05 NOTE — Telephone Encounter (Signed)
Spoke with pt and given appt with TP for f/u on Xarelto. MR - FYI this pt was scheduled with TP due to your schedule being booked.

## 2014-12-12 ENCOUNTER — Ambulatory Visit (INDEPENDENT_AMBULATORY_CARE_PROVIDER_SITE_OTHER): Payer: Self-pay | Admitting: Adult Health

## 2014-12-12 ENCOUNTER — Encounter: Payer: Self-pay | Admitting: Adult Health

## 2014-12-12 VITALS — BP 112/78 | HR 82 | Temp 98.4°F | Ht 72.0 in | Wt 228.0 lb

## 2014-12-12 DIAGNOSIS — I2699 Other pulmonary embolism without acute cor pulmonale: Secondary | ICD-10-CM

## 2014-12-12 NOTE — Patient Instructions (Signed)
Continue on Xarelto 20mg  daily .  Avoid NSAIDS such as Advil, aleve, ibuprofen, etc.  No contact sports.  Report any signs of bleeding.  Follow up Dr. Marchelle Gearingamaswamy in 3-4 months and As needed

## 2014-12-12 NOTE — Progress Notes (Signed)
   Subjective:    Patient ID: Marvin Escobar, male    DOB: 1975/04/10, 39 y.o.   MRN: [id number]  HPI 39 year old male with history of pulmonary embolism in 2011. With a negative hypercoagulable workup.  12/12/2014 follow-up: Recurrent pulmonary embolus on Patient returns for a  one-month follow-up Patient was seen one month ago for worsening shortness of breath. A d-dimer was elevated and patient went for a subsequent VQ scan. VQ was positive for recurrent pulmonary emboli.  Patient declined hospitalization. He was started on Xarelto. He has finished 3 weeks of Xarelto 15 mg twice daily and will transition to Xarelto 20 mg daily tomorrow. He says that he is doing well, shortness of breath has resolved. He is able to return to his normal exercise and activity levels. He denies any known bleeding. He denies any calf pain or swelling. Patient had a previous pulmonary embolism in 2011 with a negative hypercoagulable workup. He does have a grandfather passed away from PE. Patient does not have any medical insurance but says that he can afford Xarelto. He does not want to use Coumadin. He denies any chest pain, orthopnea, PND, leg swelling or hemoptysis.    Review of Systems Constitutional:   No  weight loss, night sweats,  Fevers, chills, fatigue, or  lassitude.  HEENT:   No headaches,  Difficulty swallowing,  Tooth/dental problems, or  Sore throat,                No sneezing, itching, ear ache, nasal congestion, post nasal drip,   CV:  No chest pain,  Orthopnea, PND, swelling in lower extremities, anasarca, dizziness, palpitations, syncope.   GI  No heartburn, indigestion, abdominal pain, nausea, vomiting, diarrhea, change in bowel habits, loss of appetite, bloody stools.   Resp: No shortness of breath with exertion or at rest.  No excess mucus, no productive cough,  No non-productive cough,  No coughing up of blood.  No change in color of mucus.  No wheezing.  No chest wall deformity  Skin: no  rash or lesions.  GU: no dysuria, change in color of urine, no urgency or frequency.  No flank pain, no hematuria   MS:  No joint pain or swelling.  No decreased range of motion.  No back pain.  Psych:  No change in mood or affect. No depression or anxiety.  No memory loss.         Objective:   Physical Exam GEN: A/Ox3; pleasant , NAD, well nourished   HEENT:  [place]/AT,  EACs-clear, TMs-wnl, NOSE-clear, THROAT-clear, no lesions, no postnasal drip or exudate noted.   NECK:  Supple w/ fair ROM; no JVD; normal carotid impulses w/o bruits; no thyromegaly or nodules palpated; no lymphadenopathy.  RESP  Clear  P & A; w/o, wheezes/ rales/ or rhonchi.no accessory muscle use, no dullness to percussion  CARD:  RRR, no m/r/g  , no peripheral edema, pulses intact, no cyanosis or clubbing.  GI:   Soft & nt; nml bowel sounds; no organomegaly or masses detected.  Musco: Warm bil, no deformities or joint swelling noted.   Neuro: alert, no focal deficits noted.    Skin: Warm, no lesions or rashes         Assessment & Plan:

## 2014-12-12 NOTE — Assessment & Plan Note (Signed)
Recurrent pulmonary embolism Patient appears to be doing well on Xarelto. This is patient's second PE is considered unprovoked except the patient does travel quite a bit with his work. It is as been discussed with patient that he will need lifelong anticoagulation. We discussed today the need to report any signs of known bleeding. He is to avoid contact sports and activities and place him at risk for bleeding. To avoid nonsteroidals such as Advil, ibuprofen or Aleve.  Plan Continue on Xarelto 20mg  daily .  Avoid NSAIDS such as Advil, aleve, ibuprofen, etc.  No contact sports.  Report any signs of bleeding.  Follow up Dr. Marchelle Gearingamaswamy in 3-4 months and As needed

## 2014-12-20 ENCOUNTER — Telehealth: Payer: Self-pay | Admitting: Internal Medicine

## 2014-12-20 MED ORDER — RIVAROXABAN 20 MG PO TABS: 20.0000 mg | ORAL_TABLET | Freq: Every day | ORAL | Status: DC

## 2014-12-20 NOTE — Telephone Encounter (Signed)
Spoke with pt, needs xarelto sent to pharmacy.  This has been done.  Nothing further needed.

## 2014-12-20 NOTE — Telephone Encounter (Signed)
[email or web address]

## 2014-12-20 NOTE — Telephone Encounter (Signed)
Patient returned call, may be reached at [phone]

## 2015-02-02 ENCOUNTER — Ambulatory Visit: Payer: Self-pay | Admitting: Internal Medicine

## 2015-03-15 ENCOUNTER — Ambulatory Visit: Payer: Self-pay | Admitting: Internal Medicine

## 2015-03-16 ENCOUNTER — Ambulatory Visit: Payer: Self-pay | Admitting: Internal Medicine

## 2015-05-11 ENCOUNTER — Encounter: Payer: Self-pay | Admitting: Internal Medicine

## 2015-05-11 ENCOUNTER — Ambulatory Visit (INDEPENDENT_AMBULATORY_CARE_PROVIDER_SITE_OTHER): Payer: Self-pay | Admitting: Internal Medicine

## 2015-05-11 VITALS — BP 100/64 | HR 64 | Ht 77.0 in | Wt 237.2 lb

## 2015-05-11 DIAGNOSIS — I2699 Other pulmonary embolism without acute cor pulmonale: Secondary | ICD-10-CM

## 2015-05-11 NOTE — Patient Instructions (Signed)
ICD-9-CM ICD-10-CM   1. 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

## 2015-05-11 NOTE — Progress Notes (Signed)
Subjective:     Patient ID: Marvin Escobar, male   DOB: Aug 21, 1975, 40 y.o.   MRN: [id number]  HPI   40 year old male with history of pulmonary embolism in 2011. With a negative hypercoagulable workup.  12/12/2014 follow-up: Recurrent pulmonary embolus on Patient returns for a  one-month follow-up Patient was seen one month ago for worsening shortness of breath. A d-dimer was elevated and patient went for a subsequent VQ scan. VQ was positive for recurrent pulmonary emboli.  Patient declined hospitalization. He was started on Xarelto. He has finished 3 weeks of Xarelto 15 mg twice daily and will transition to Xarelto 20 mg daily tomorrow. He says that he is doing well, shortness of breath has resolved. He is able to return to his normal exercise and activity levels. He denies any known bleeding. He denies any calf pain or swelling. Patient had a previous pulmonary embolism in 2011 with a negative hypercoagulable workup. He does have a grandfather passed away from PE. Patient does not have any medical insurance but says that he can afford Xarelto. He does not want to use Coumadin. He denies any chest pain, orthopnea, PND, leg swelling or hemoptysis.   OV 05/11/2015  Chief Complaint  Patient presents with  . Follow-up    breathing doing well.  No concerns.   Follow-up recurrent pulmonary embolism  He is now rivaroxaban 20 mg daily. This is a lifelong treatment. He has been on second round of rivaroxaban since fall 2016. Reports no problems. He continues to smoke. He is gained weight. Otherwise she is functional. 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 No complaionts. No bleeding episodes    has a past medical history of Tobacco abuse and Pulmonary embolism (HCC).   reports that he has been smoking Cigarettes.  He has a 17 pack-year smoking history. He has never used smokeless tobacco.  No past surgical history on file.  No Known Allergies  Immunization History   Administered Date(s) Administered  . Influenza Split 02/26/2011  . Influenza Whole 11/20/2008  . Influenza,inj,Quad PF,36+ Mos 11/14/2014  . Influenza-Unspecified 12/23/2013    Family History  Problem Relation Age of Onset  . Hypertension Mother   . Angina Father      Current outpatient prescriptions:  .  rivaroxaban (XARELTO) 20 MG TABS tablet, Take 1 tablet (20 mg total) by mouth daily with supper., Disp: 30 tablet, Rfl: 5     Review of Systems     Objective:   Physical Exam  Constitutional: He is oriented to person, place, and time. He appears well-developed and well-nourished. No distress.  HENT:  Head: Normocephalic and atraumatic.  Right Ear: External ear normal.  Left Ear: External ear normal.  Mouth/Throat: Oropharynx is clear and moist. No oropharyngeal exudate.  Eyes: Conjunctivae and EOM are normal. Pupils are equal, round, and reactive to light. Right eye exhibits no discharge. Left eye exhibits no discharge. No scleral icterus.  Neck: Normal range of motion. Neck supple. No JVD present. No tracheal deviation present. No thyromegaly present.  Cardiovascular: Normal rate, regular rhythm and intact distal pulses.  Exam reveals no gallop and no friction rub.   No murmur heard. Pulmonary/Chest: Effort normal and breath sounds normal. No respiratory distress. He has no wheezes. He has no rales. He exhibits no tenderness.  Abdominal: Soft. Bowel sounds are normal. He exhibits no distension and no mass. There is no tenderness. There is no rebound and no guarding.  Musculoskeletal: Normal range of motion.  He exhibits no edema or tenderness.  Lymphadenopathy:    He has no cervical adenopathy.  Neurological: He is alert and oriented to person, place, and time. He has normal reflexes. No cranial nerve deficit. Coordination normal.  Skin: Skin is warm and dry. No rash noted. He is not diaphoretic. No erythema. No pallor.  Psychiatric: He has a normal mood and affect. His  behavior is normal. Judgment and thought content normal.  Nursing note and vitals reviewed.   Filed Vitals:   05/11/15 1515  BP: 100/64  Pulse: 64  Height: 6\' 5"  (1.956 m)  Weight: 237 lb 3.2 oz (107.593 kg)  SpO2: 98%   Estimated body mass index is 28.12 kg/(m^2) as calculated from the following:   Height as of this encounter: 6\' 5"  (1.956 m).   Weight as of this encounter: 237 lb 3.2 oz (107.593 kg).       Assessment:       ICD-9-CM ICD-10-CM   1. 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 Kalman ShanMurali Halyn Flaugher, M.D., [facility]P Pulmonary and Critical Care Medicine Staff Physician [facility] [place] [facility] Pager: [phone], If no answer or between  15:00h - 7:00h: call [phone]  05/11/2015 4:09 PM

## 2015-06-17 ENCOUNTER — Other Ambulatory Visit: Payer: Self-pay | Admitting: Internal Medicine

## 2016-01-04 ENCOUNTER — Other Ambulatory Visit: Payer: Self-pay | Admitting: Internal Medicine

## 2016-06-19 ENCOUNTER — Ambulatory Visit: Payer: Self-pay | Admitting: Internal Medicine

## 2016-06-23 ENCOUNTER — Other Ambulatory Visit: Payer: Self-pay

## 2016-06-23 ENCOUNTER — Ambulatory Visit (INDEPENDENT_AMBULATORY_CARE_PROVIDER_SITE_OTHER): Payer: Self-pay | Admitting: Internal Medicine

## 2016-06-23 ENCOUNTER — Encounter: Payer: Self-pay | Admitting: Internal Medicine

## 2016-06-23 DIAGNOSIS — Z86711 Personal history of pulmonary embolism: Secondary | ICD-10-CM

## 2016-06-23 DIAGNOSIS — F17200 Nicotine dependence, unspecified, uncomplicated: Secondary | ICD-10-CM

## 2016-06-23 MED ORDER — RIVAROXABAN 20 MG PO TABS: ORAL_TABLET | ORAL | 5 refills | Status: DC

## 2016-06-23 NOTE — Progress Notes (Signed)
Subjective:     Patient ID: Marvin Escobar, male   DOB: Apr 02, 1975, 41 y.o.   MRN: [id number]  HPI 41 year old male with history of pulmonary embolism in 2011. With a negative hypercoagulable workup.  12/12/2014 follow-up: Recurrent pulmonary embolus on Patient returns for a  one-month follow-up Patient was seen one month ago for worsening shortness of breath. A d-dimer was elevated and patient went for a subsequent VQ scan. VQ was positive for recurrent pulmonary emboli.  Patient declined hospitalization. He was started on Xarelto. He has finished 3 weeks of Xarelto 15 mg twice daily and will transition to Xarelto 20 mg daily tomorrow. He says that he is doing well, shortness of breath has resolved. He is able to return to his normal exercise and activity levels. He denies any known bleeding. He denies any calf pain or swelling. Patient had a previous pulmonary embolism in 2011 with a negative hypercoagulable workup. He does have a grandfather passed away from PE. Patient does not have any medical insurance but says that he can afford Xarelto. He does not want to use Coumadin. He denies any chest pain, orthopnea, PND, leg swelling or hemoptysis.   OV 05/11/2015  Chief Complaint  Patient presents with  . Follow-up    breathing doing well.  No concerns.   Follow-up recurrent pulmonary embolism  He is now rivaroxaban 20 mg daily. This is a lifelong treatment. He has been on second round of rivaroxaban since fall 2016. Reports no problems. He continues to smoke. He is gained weight. Otherwise she is functional. 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 No complaionts. No bleeding episodes    OV 06/23/2016  Chief Complaint  Patient presents with  . Follow-up    Pt here for yearly f/u for PE. Pt denies SOB, cough, and CP/tightness.     Follow-up pulmonary embolism and recurrent   Visit one year follow-up. Last seen March 2017. Since then he's been doing well. He has  lost a lot of weight upon my advice because of the risk factor for pulmonary embolism. He did this through [employer]. He is active running. He denies any complaints other than vague feelings of hot flashes when he is in a stooped position  and lifts his head up. but it is rare and mild. He is also complaining of periodic attention deficit memory lapses. His daughter found to have ADHD. He does not have a primary care physician. No bleeding episodes. No chest pain or shortness of breath   has a past medical history of Pulmonary embolism (HCC) and Tobacco abuse.   reports that he has been smoking Cigarettes.  He has a 17.00 pack-year smoking history. He has never used smokeless tobacco.  No past surgical history on file.  No Known Allergies  Immunization History  Administered Date(s) Administered  . Influenza Split 02/26/2011  . Influenza Whole 11/20/2008  . Influenza,inj,Quad PF,36+ Mos 11/14/2014  . Influenza-Unspecified 12/23/2013    Family History  Problem Relation Age of Onset  . Hypertension Mother   . Angina Father      Current Outpatient Prescriptions:  .  XARELTO 20 MG TABS tablet, TAKE 1 TABLET (20 MG TOTAL) BY MOUTH DAILY WITH SUPPER., Disp: 30 tablet, Rfl: 5   Review of Systems     Objective:   Physical Exam  Constitutional: He is oriented to person, place, and time. He appears well-developed and well-nourished. No distress.  HENT:  Head: Normocephalic and atraumatic.  Right Ear:  External ear normal.  Left Ear: External ear normal.  Mouth/Throat: Oropharynx is clear and moist. No oropharyngeal exudate.  Eyes: Conjunctivae and EOM are normal. Pupils are equal, round, and reactive to light. Right eye exhibits discharge. Left eye exhibits no discharge. No scleral icterus.  Neck: Normal range of motion. Neck supple. No JVD present. No tracheal deviation present. No thyromegaly present.  Cardiovascular: Normal rate, regular rhythm and intact distal pulses.  Exam  reveals no gallop and no friction rub.   No murmur heard. Pulmonary/Chest: Effort normal and breath sounds normal. No respiratory distress. He has no wheezes. He has no rales. He exhibits no tenderness.  Abdominal: Soft. Bowel sounds are normal. He exhibits no distension and no mass. There is no tenderness. There is no rebound and no guarding.  Musculoskeletal: Normal range of motion. He exhibits no edema or tenderness.  Lymphadenopathy:    He has no cervical adenopathy.  Neurological: He is alert and oriented to person, place, and time. He has normal reflexes. No cranial nerve deficit. Coordination normal.  Skin: Skin is warm and dry. No rash noted. He is not diaphoretic. No erythema. No pallor.  Psychiatric: He has a normal mood and affect. His behavior is normal. Judgment and thought content normal.  Nursing note and vitals reviewed.    Vitals:   06/23/16 1532  BP: 104/62  Pulse: (!) 57  SpO2: 97%  Weight: 178 lb 6.4 oz (80.9 kg)  Height: 6' (1.829 m)   Estimated body mass index is 24.2 kg/m as calculated from the following:   Height as of this encounter: 6' (1.829 m).   Weight as of this encounter: 178 lb 6.4 oz (80.9 kg).      Assessment:       ICD-9-CM ICD-10-CM   1. TOBACCO ABUSE 305.1 F17.200   2. History of pulmonary embolism V12.55 Z86.711        Plan:     TOBACCO ABUSE Please quit smoking becaues this is a minor risk factor for blood clot and of course risk factor for heart attack and lung cancer  History of pulmonary embolism Glad you are stable Vague symptoms doubt is related to this issue Glad you lost weight   Plan Check d-dimer 06/23/2016 to predict risk; will call with results cotniue xarelto 20mg  daily  Followup 1 year or sooner if needed   Dr. Kalman Shan, M.D., [facility].C.P Pulmonary and Critical Care Medicine Staff Physician [place] [facility] [place] [facility] Pager: [phone], If no answer or between  15:00h -  7:00h: call [phone]  06/23/2016 3:52 PM

## 2016-06-23 NOTE — Addendum Note (Signed)
Addended by: Sheran LuzEAST, Ricahrd Schwager K on: 06/23/2016 04:01 PM   Modules accepted: Orders

## 2016-06-23 NOTE — Assessment & Plan Note (Addendum)
Glad you are stable Vague symptoms doubt is related to this issue Glad you lost weight   Plan Check d-dimer 06/23/2016 to predict risk; will call with results cotniue xarelto 20mg  daily  Followup 1 year or sooner if needed

## 2016-06-23 NOTE — Assessment & Plan Note (Signed)
Please quit smoking becaues this is a minor risk factor for blood clot and of course risk factor for heart attack and lung cancer

## 2016-06-23 NOTE — Patient Instructions (Addendum)
TOBACCO ABUSE Please quit smoking becaues this is a minor risk factor for blood clot and of course risk factor for heart attack and lung cancer  History of pulmonary embolism Glad you are stable Vague symptoms doubt is related to this issue Glad you lost weight   Plan Check d-dimer 06/23/2016 to predict risk; will call with results cotniue xarelto 20mg  daily  Followup 1 year or sooner if needed

## 2016-06-24 ENCOUNTER — Telehealth: Payer: Self-pay | Admitting: Internal Medicine

## 2016-06-24 LAB — D-DIMER, QUANTITATIVE: D-Dimer, Quant: 0.31 mcg/mL FEU (ref <0.50)

## 2016-06-24 NOTE — Telephone Encounter (Signed)
D-dimer norjmal suggesting risk for clot next 1 year is low but due to recurrent PE and family hx - continue full dose xarelto    ROV  1 year  Dr. Kalman ShanMurali Damiah Mcdonald, M.D., [facility].C.P Pulmonary and Critical Care Medicine Staff Physician [facility] [place] [facility] Pager: [phone], If no answer or between  15:00h - 7:00h: call [phone]  06/24/2016 5:49 PM

## 2016-06-26 NOTE — Telephone Encounter (Signed)
Called spoke with patient Advised of lab results/recs as stated by MR Pt voiced his understanding and denied any questions/concerns Pt already has recall in system for follow up in May 2019  Nothing further needed; will sign off

## 2017-01-30 ENCOUNTER — Other Ambulatory Visit: Payer: Self-pay | Admitting: *Deleted

## 2017-01-30 MED ORDER — RIVAROXABAN 20 MG PO TABS: ORAL_TABLET | ORAL | 5 refills | Status: DC

## 2017-06-30 ENCOUNTER — Ambulatory Visit: Payer: Self-pay | Admitting: Internal Medicine

## 2017-07-18 DIAGNOSIS — R7303 Prediabetes: Secondary | ICD-10-CM

## 2017-07-18 HISTORY — DX: Prediabetes: R73.03

## 2017-07-27 ENCOUNTER — Encounter: Payer: Self-pay | Admitting: Family Medicine

## 2017-07-27 ENCOUNTER — Ambulatory Visit: Payer: Self-pay | Admitting: Family Medicine

## 2017-07-27 VITALS — BP 107/71 | HR 77 | Temp 99.2°F | Resp 16 | Ht 71.5 in | Wt 194.0 lb

## 2017-07-27 DIAGNOSIS — Z23 Encounter for immunization: Secondary | ICD-10-CM

## 2017-07-27 DIAGNOSIS — I2699 Other pulmonary embolism without acute cor pulmonale: Secondary | ICD-10-CM

## 2017-07-27 DIAGNOSIS — Z0000 Encounter for general adult medical examination without abnormal findings: Secondary | ICD-10-CM

## 2017-07-27 DIAGNOSIS — E663 Overweight: Secondary | ICD-10-CM

## 2017-07-27 DIAGNOSIS — N5089 Other specified disorders of the male genital organs: Secondary | ICD-10-CM

## 2017-07-27 NOTE — Patient Instructions (Signed)

## 2017-07-27 NOTE — Progress Notes (Signed)
Office Note 07/27/2017  CC:  Chief Complaint  Patient presents with  . Establish Care    No prior PCP   HPI:  Marvin Escobar Peggyann Shoals") is a 42 y.o. male who is here to establish care. Patient's most recent primary MD: none Old records in EPIC/HL EMR were reviewed prior to or during today's visit.  Since he was a kid he has noted a small bump on/around R testicle and R testicle smaller than L. Also, urinating "a lot" for 3-4 mo.  No nocturia.  No dysuria, no hesitancy, no urge incont. Admits he drinks caffienated drinks all day.  He says his daughter has been dx'd with ADHD and pt feels like he may have it.  Never bothered by it before, but forgot a form at home --has some forgetfulness like this. He then says he just wanted to mention this, doesn't really want to be evaluated for this.  Discussed hx of recurrent PE's, says he was late for his most recent f/u appt with his pulm MD, Dr. Marchelle Gearing, so he was told to reschedule.  He continues to take full dose xarelto.   No excessive bruising, no melena or hematochezia, no nosebleeds, no gross hematuria. No SOB, CP, or cough.  Past Medical History:  Diagnosis Date  . Recurrent pulmonary embolism (HCC) 2011 and 2018   Negative hypercoagulable w/u 2011.  06/24/16 D-dimer norjmal suggesting risk for clot next 1 year is low, but given FH and recurrence--Xarelto indefinitely per pulm (most recent f/u visit 06/2016.)  . Tobacco abuse     History reviewed. No pertinent surgical history.  Family History  Problem Relation Age of Onset  . Hypertension Mother   . Diabetes Mother   . Hyperlipidemia Mother   . Miscarriages / [place] Mother   . Angina Father     Social History   Socioeconomic History  . Marital status: Married    Spouse name: Not on file  . Number of children: Not on file  . Years of education: Not on file  . Highest education level: Not on file  Occupational History  . Occupation: Psychologist, sport and exercise    CommentEngineer, agricultural  Social Needs  . Financial resource strain: Not on file  . Food insecurity:    Worry: Not on file    Inability: Not on file  . Transportation needs:    Medical: Not on file    Non-medical: Not on file  Tobacco Use  . Smoking status: Current Every Day Smoker    Packs/day: 1.00    Years: 17.00    Pack years: 17.00    Types: Cigarettes  . Smokeless tobacco: Never Used  . Tobacco comment: 1/2 ppd 06/23/2016 ee  Substance and Sexual Activity  . Alcohol use: No    Alcohol/week: 0.0 oz  . Drug use: No  . Sexual activity: Not on file  Lifestyle  . Physical activity:    Days per week: Not on file    Minutes per session: Not on file  . Stress: Not on file  Relationships  . Social connections:    Talks on phone: Not on file    Gets together: Not on file    Attends religious service: Not on file    Active member of club or organization: Not on file    Attends meetings of clubs or organizations: Not on file    Relationship status: Not on file  . Intimate partner violence:    Fear of current or ex partner:  Not on file    Emotionally abused: Not on file    Physically abused: Not on file    Forced sexual activity: Not on file  Other Topics Concern  . Not on file  Social History Narrative   Married, 1 daughter and 2 sons.   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   Tobacco: 20 pack yr hx-->current as of 07/2017.   Alcohol: rare.   Orig from [place], moved to [place] around 1998.   [place] since that time.    Outpatient Encounter Medications as of 07/27/2017  Medication Sig  . rivaroxaban (XARELTO) 20 MG TABS tablet TAKE 1 TABLET (20 MG TOTAL) BY MOUTH DAILY WITH SUPPER.   No facility-administered encounter medications on file as of 07/27/2017.     No Known Allergies  ROS: see above, plus Review of Systems  Constitutional: Negative for appetite change, chills, fatigue and fever.  HENT: Negative for congestion, dental problem, ear pain and sore throat.   Eyes: Negative  for discharge, redness and visual disturbance.  Respiratory: Negative for cough, chest tightness, shortness of breath and wheezing.   Cardiovascular: Negative for chest pain, palpitations and leg swelling.  Gastrointestinal: Negative for abdominal pain, blood in stool, diarrhea, nausea and vomiting.  Genitourinary: Negative for difficulty urinating, dysuria, flank pain, frequency, hematuria and urgency.  Musculoskeletal: Negative for arthralgias, back pain, joint swelling, myalgias and neck stiffness.  Skin: Negative for pallor and rash.  Neurological: Negative for dizziness, speech difficulty, weakness and headaches.  Hematological: Negative for adenopathy. Does not bruise/bleed easily.  Psychiatric/Behavioral: Negative for confusion and sleep disturbance. The patient is not nervous/anxious.     PE; Blood pressure 107/71, pulse 77, temperature 99.2 F (37.3 C), temperature source Oral, resp. rate 16, height 5' 11.5" (1.816 m), weight 194 lb (88 kg), SpO2 97 %. Gen: Alert, well appearing.  Patient is oriented to person, place, time, and situation. AFFECT: pleasant, lucid thought and speech. ENT: Ears: EACs clear, normal epithelium.  TMs with good light reflex and landmarks bilaterally.  Eyes: no injection, icteris, swelling, or exudate.  EOMI, PERRLA. Nose: no drainage or turbinate edema/swelling.  No injection or focal lesion.  Mouth: lips without lesion/swelling.  Oral mucosa pink and moist.  Dentition intact and without obvious caries or gingival swelling.  Oropharynx without erythema, exudate, or swelling.  Neck: supple/nontender.  No LAD, mass, or TM.  Carotid pulses 2+ bilaterally, without bruits. CV: RRR, no m/r/g.   LUNGS: CTA bilat, nonlabored resps, good aeration in all lung fields. ABD: soft, NT, ND, BS normal.  No hepatospenomegaly or mass.  No bruits. EXT: no clubbing, cyanosis, or edema.  Musculoskeletal: no joint swelling, erythema, warmth, or tenderness.  ROM of all joints  intact. Skin - no sores or suspicious lesions or rashes or color changes Genitals:  R testicle notably smaller than L.  There is a bee-bee sized, hard nodule on back of epididymus on R testicle.  No tenderness.  Question a couple of similar smaller hard nodules in the vascular bundle going to/leading from R testicle.    Pertinent labs:  No results found for: TSH Lab Results  Component Value Date   WBC 7.8 04/04/2009   HGB 14.4 04/04/2009   HCT 41.2 04/04/2009   MCV 84.9 04/04/2009   PLT 156 04/04/2009   Lab Results  Component Value Date   CREATININE 1.2 06/30/2012   BUN 16 06/30/2012   NA 138 06/30/2012   K 4.4 06/30/2012   CL 103 06/30/2012  CO2 30 06/30/2012   Lab Results  Component Value Date   ALT 26 04/03/2009   AST 22 04/03/2009   ALKPHOS 82 04/03/2009   BILITOT 0.6 04/03/2009   Lab Results  Component Value Date   CHOL  04/04/2009    154        ATP III CLASSIFICATION:  <200     mg/dL   Desirable  960-454  mg/dL   Borderline High  >=098    mg/dL   High          Lab Results  Component Value Date   HDL 35 (L) 04/04/2009   Lab Results  Component Value Date   [facility]  04/04/2009    64        Total Cholesterol/HDL:CHD Risk Coronary Heart Disease Risk Table                     Men   Women  1/2 Average Risk   3.4   3.3  Average Risk       5.0   4.4  2 X Average Risk   9.6   7.1  3 X Average Risk  23.4   11.0        Use the calculated Patient Ratio above and the CHD Risk Table to determine the patient's CHD Risk.        ATP III CLASSIFICATION (LDL):  <100     mg/dL   Optimal  119-147  mg/dL   Near or Above                    Optimal  130-159  mg/dL   Borderline  829-562  mg/dL   High  >130     mg/dL   Very High   Lab Results  Component Value Date   TRIG 276 (H) 04/04/2009   Lab Results  Component Value Date   CHOLHDL 4.4 04/04/2009    ASSESSMENT AND PLAN:   New pt: no old PCP records.  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 Reassured.  Pt not worried.  He declined scrotal u/s at this time. Signs/symptoms to call or return for were reviewed and pt expressed understanding.   2) Recurrent PE: continue xarelto 20mg  qd. Will ask Dr. Marchelle Gearing whether pt needs to take full dose vs half dose xarelto since his last PE was approx 16-18 mo ago.  3) Health maintenance exam: Reviewed age and gender appropriate health maintenance issues (prudent diet, regular exercise, health risks of tobacco and excessive alcohol, use of seatbelts, fire alarms in home, use of sunscreen).  Also reviewed age and gender appropriate health screening as well as vaccine recommendations. Vaccines: Tdap today. Labs: return for fasting HP labs. Prostate ca screening: average risk patient= start screening at age 32 yrs. Colon ca screening: average risk patient= start screening at age 89 yrs.  An After Visit Summary was printed and given to the patient.  Return in about 6 months (around 01/26/2018) for routine chronic illness f/u (recurrent PE->on xarelto.  Signed:  Santiago Bumpers, MD           07/27/2017

## 2017-07-28 ENCOUNTER — Other Ambulatory Visit (INDEPENDENT_AMBULATORY_CARE_PROVIDER_SITE_OTHER): Payer: Self-pay

## 2017-07-28 DIAGNOSIS — Z0000 Encounter for general adult medical examination without abnormal findings: Secondary | ICD-10-CM

## 2017-07-28 LAB — CBC WITH DIFFERENTIAL/PLATELET
Basophils Absolute: 0.1 10*3/uL (ref 0.0–0.1)
Basophils Relative: 1.2 % (ref 0.0–3.0)
Eosinophils Absolute: 0.2 10*3/uL (ref 0.0–0.7)
Eosinophils Relative: 2.3 % (ref 0.0–5.0)
HCT: 46.7 % (ref 39.0–52.0)
Hemoglobin: 15.8 g/dL (ref 13.0–17.0)
LYMPHS ABS: 1.8 10*3/uL (ref 0.7–4.0)
Lymphocytes Relative: 26.9 % (ref 12.0–46.0)
MCHC: 33.8 g/dL (ref 30.0–36.0)
MCV: 87.9 fl (ref 78.0–100.0)
MONO ABS: 0.5 10*3/uL (ref 0.1–1.0)
Monocytes Relative: 7.2 % (ref 3.0–12.0)
NEUTROS PCT: 62.4 % (ref 43.0–77.0)
Neutro Abs: 4.1 10*3/uL (ref 1.4–7.7)
Platelets: 196 10*3/uL (ref 150.0–400.0)
RBC: 5.31 Mil/uL (ref 4.22–5.81)
RDW: 13.9 % (ref 11.5–15.5)
WBC: 6.6 10*3/uL (ref 4.0–10.5)

## 2017-07-28 LAB — COMPREHENSIVE METABOLIC PANEL
ALT: 25 U/L (ref 0–53)
AST: 22 U/L (ref 0–37)
Albumin: 4.4 g/dL (ref 3.5–5.2)
Alkaline Phosphatase: 71 U/L (ref 39–117)
BUN: 15 mg/dL (ref 6–23)
CHLORIDE: 101 meq/L (ref 96–112)
CO2: 33 meq/L — AB (ref 19–32)
Calcium: 9.6 mg/dL (ref 8.4–10.5)
Creatinine, Ser: 1.36 mg/dL (ref 0.40–1.50)
GFR: 61.36 mL/min (ref >60.00)
GLUCOSE: 108 mg/dL — AB (ref 70–99)
POTASSIUM: 4.3 meq/L (ref 3.5–5.1)
SODIUM: 140 meq/L (ref 135–145)
Total Bilirubin: 0.5 mg/dL (ref 0.2–1.2)
Total Protein: 6.5 g/dL (ref 6.0–8.3)

## 2017-07-28 LAB — LIPID PANEL
Cholesterol: 178 mg/dL (ref 0–200)
HDL: 44.6 mg/dL (ref >39.00)
LDL CALC: 117 mg/dL — AB (ref 0–99)
NONHDL: 133.52
Total CHOL/HDL Ratio: 4
Triglycerides: 85 mg/dL (ref 0.0–149.0)
VLDL: 17 mg/dL (ref 0.0–40.0)

## 2017-07-28 LAB — TSH: TSH: 1.54 u[IU]/mL (ref 0.35–4.50)

## 2017-07-29 ENCOUNTER — Other Ambulatory Visit (INDEPENDENT_AMBULATORY_CARE_PROVIDER_SITE_OTHER): Payer: Self-pay

## 2017-07-29 ENCOUNTER — Encounter: Payer: Self-pay | Admitting: Family Medicine

## 2017-07-29 DIAGNOSIS — R7301 Impaired fasting glucose: Secondary | ICD-10-CM

## 2017-07-29 LAB — HEMOGLOBIN A1C: HEMOGLOBIN A1C: 6 % (ref 4.6–6.5)

## 2018-01-26 ENCOUNTER — Encounter: Payer: Self-pay | Admitting: Family Medicine

## 2018-01-26 ENCOUNTER — Ambulatory Visit: Payer: Self-pay | Admitting: Family Medicine

## 2018-01-26 VITALS — BP 102/73 | HR 63 | Temp 97.2°F | Resp 16 | Ht 71.5 in | Wt 204.0 lb

## 2018-01-26 DIAGNOSIS — Z7901 Long term (current) use of anticoagulants: Secondary | ICD-10-CM

## 2018-01-26 DIAGNOSIS — F17200 Nicotine dependence, unspecified, uncomplicated: Secondary | ICD-10-CM

## 2018-01-26 DIAGNOSIS — I2699 Other pulmonary embolism without acute cor pulmonale: Secondary | ICD-10-CM

## 2018-01-26 DIAGNOSIS — R7303 Prediabetes: Secondary | ICD-10-CM

## 2018-01-26 LAB — HEMOGLOBIN A1C: HEMOGLOBIN A1C: 5.9 % (ref 4.6–6.5)

## 2018-01-26 LAB — CBC
HEMATOCRIT: 47.3 % (ref 39.0–52.0)
Hemoglobin: 16.1 g/dL (ref 13.0–17.0)
MCHC: 34 g/dL (ref 30.0–36.0)
MCV: 87.4 fl (ref 78.0–100.0)
Platelets: 210 10*3/uL (ref 150.0–400.0)
RBC: 5.41 Mil/uL (ref 4.22–5.81)
RDW: 13.6 % (ref 11.5–15.5)
WBC: 10.6 10*3/uL — ABNORMAL HIGH (ref 4.0–10.5)

## 2018-01-26 LAB — BASIC METABOLIC PANEL
BUN: 17 mg/dL (ref 6–23)
CALCIUM: 9.6 mg/dL (ref 8.4–10.5)
CHLORIDE: 102 meq/L (ref 96–112)
CO2: 32 mEq/L (ref 19–32)
CREATININE: 1.27 mg/dL (ref 0.40–1.50)
GFR: 66.24 mL/min (ref >60.00)
Glucose, Bld: 107 mg/dL — ABNORMAL HIGH (ref 70–99)
Potassium: 4.5 mEq/L (ref 3.5–5.1)
Sodium: 138 mEq/L (ref 135–145)

## 2018-01-26 NOTE — Progress Notes (Signed)
OFFICE VISIT  01/26/2018   CC:  Chief Complaint  Patient presents with  . Follow-up    RCI   HPI:    Patient is a 42 y.o.  male who presents for 6 mo f/u prediabetes, tob abuse/dependence, and recurrent PE.  Prediabetes: cutting down on carbohydrates.  No exercise right now. He last ate about 13 hours ago, but had an energy drink Actor(Monster) about 1 hour ago.  Smoking 1/2 pack per day.  Wants to quit in 2020. He quit for 1 yr on chantix in the past--will likely try this again.  Recurrent PE: followed by Dr. Marchelle Gearingamaswamy in the past but it has been 18 months since he saw him last. Last recommendation was to stay on 20mg  xarelto lifetime. Pt has no leg pain or swelling, nor does he have SOB, DOE, or CP.   No melena, hematochezia, nosebleeds, or gross hematuria.  Pt is asking if he could possibly decrease his xarelto dose.   Past Medical History:  Diagnosis Date  . Prediabetes 07/2017   Fasting gluc 109; A1c 6.0%.  . Recurrent pulmonary embolism (HCC) 2011 and 2018   Negative hypercoagulable w/u 2011.  06/24/16 D-dimer norjmal suggesting risk for clot next 1 year is low, but given FH and recurrence--Xarelto indefinitely per pulm (most recent f/u visit 06/2016.)  . Tobacco abuse     History reviewed. No pertinent surgical history.  Outpatient Medications Prior to Visit  Medication Sig Dispense Refill  . rivaroxaban (XARELTO) 20 MG TABS tablet TAKE 1 TABLET (20 MG TOTAL) BY MOUTH DAILY WITH SUPPER. 90 tablet 5   No facility-administered medications prior to visit.     No Known Allergies  ROS As per HPI  PE: Blood pressure 102/73, pulse 63, temperature (!) 97.2 F (36.2 C), temperature source Oral, resp. rate 16, height 5' 11.5" (1.816 m), weight 204 lb (92.5 kg), SpO2 97 %. Gen: Alert, well appearing.  Patient is oriented to person, place, time, and situation. AFFECT: pleasant, lucid thought and speech. CV: RRR, no m/r/g.   LUNGS: CTA bilat, nonlabored resps, good  aeration in all lung fields. EXT: no clubbing or cyanosis.  no edema.    LABS:  Lab Results  Component Value Date   TSH 1.54 07/28/2017   Lab Results  Component Value Date   WBC 6.6 07/28/2017   HGB 15.8 07/28/2017   HCT 46.7 07/28/2017   MCV 87.9 07/28/2017   PLT 196.0 07/28/2017   Lab Results  Component Value Date   CREATININE 1.36 07/28/2017   BUN 15 07/28/2017   NA 140 07/28/2017   K 4.3 07/28/2017   CL 101 07/28/2017   CO2 33 (H) 07/28/2017   Lab Results  Component Value Date   ALT 25 07/28/2017   AST 22 07/28/2017   ALKPHOS 71 07/28/2017   BILITOT 0.5 07/28/2017   Lab Results  Component Value Date   CHOL 178 07/28/2017   Lab Results  Component Value Date   HDL 44.60 07/28/2017   Lab Results  Component Value Date   LDLCALC 117 (H) 07/28/2017   Lab Results  Component Value Date   TRIG 85.0 07/28/2017   Lab Results  Component Value Date   CHOLHDL 4 07/28/2017   Lab Results  Component Value Date   HGBA1C 6.0 07/29/2017    IMPRESSION AND PLAN:  1) Recurrent PE: will contact Dr. Marchelle Gearingamaswamy and inquire about the possibility of decreasing xarelto dose.  2) Tobacco dependence: he is cutting back.  Encouraged total  cessation, which he plans on trying again after the new year--he'll try chantix again.  3) Prediabetes: continue to work harder on TLC.  Message sent to Dr. Marchelle Gearing inquiring about possible decrease in xarelto dose to 10mg  qd. UTD with flu vaccine.  CBC, BMET, A1c done today.  An After Visit Summary was printed and given to the patient.  FOLLOW UP: 6 mo fasting CPE  Signed:  Santiago Bumpers, MD           01/26/2018  ADDENDUM 02/05/18: I asked Dr. Marchelle Gearing about lowering pt's xarelto dose. He recommended I recheck a D -dimer blood test.  If this test comes back normal then I'll decrease his dose to 10mg  once a day LIFETIME.  If it comes back elevated then we'll have to keep him on current 20mg  qd dosing. Will order future d dimer,  dx is recurrent pulmonary embolus Signed:  Santiago Bumpers, MD           02/07/2018

## 2018-02-05 ENCOUNTER — Telehealth: Payer: Self-pay | Admitting: Family Medicine

## 2018-02-05 NOTE — Telephone Encounter (Signed)
Pls notify pt that I asked Dr. Marchelle Gearingamaswamy about lowering pt's xarelto dose. He recommended I recheck a D -dimer blood test.  If this test comes back normal then I'll decrease his dose to 10mg  once a day LIFETIME.  If it comes back elevated then we'll have to keep him on current 20mg  qd dosing. Pls order future d dimer, dx is recurrent pulmonary embolus.--thx!

## 2018-02-05 NOTE — Telephone Encounter (Signed)
Tried calling pt, NA and unable to leave a message due to vm box being full or not set up yet.  

## 2018-02-08 NOTE — Telephone Encounter (Signed)
Left message for pt to call back  °

## 2018-02-11 NOTE — Telephone Encounter (Signed)
Spoke w pt regarding Dr. Samul DadaMcGowen's message. Advice given and appt made for labs on 02/15/2018 at 8:45am.

## 2018-02-15 ENCOUNTER — Other Ambulatory Visit (INDEPENDENT_AMBULATORY_CARE_PROVIDER_SITE_OTHER): Payer: Self-pay

## 2018-02-15 ENCOUNTER — Other Ambulatory Visit: Payer: Self-pay | Admitting: Family Medicine

## 2018-02-15 DIAGNOSIS — I2699 Other pulmonary embolism without acute cor pulmonale: Secondary | ICD-10-CM

## 2018-02-16 ENCOUNTER — Encounter: Payer: Self-pay | Admitting: Family Medicine

## 2018-02-16 ENCOUNTER — Other Ambulatory Visit: Payer: Self-pay | Admitting: *Deleted

## 2018-02-16 LAB — D-DIMER, QUANTITATIVE: D-Dimer, Quant: <0.19 mcg/mL FEU (ref <0.50)

## 2018-02-16 MED ORDER — RIVAROXABAN 10 MG PO TABS: ORAL_TABLET | ORAL | 1 refills | Status: DC

## 2018-02-22 ENCOUNTER — Other Ambulatory Visit: Payer: Self-pay | Admitting: *Deleted

## 2018-02-22 ENCOUNTER — Ambulatory Visit: Payer: Self-pay | Admitting: *Deleted

## 2018-02-22 MED ORDER — RIVAROXABAN 10 MG PO TABS: 10.0000 mg | ORAL_TABLET | Freq: Every day | ORAL | 1 refills | Status: DC

## 2018-02-22 NOTE — Telephone Encounter (Signed)
SW John at [facility], Rx that was sent on 02/16/18 had sig for pt to take one 20mg  tab daily but Rx was for 10mg .   Advised John that pt is to take 10mg  daily, Jonny Ruiz voiced understanding. Medication updated in chart.

## 2018-02-22 NOTE — Telephone Encounter (Signed)
Doreen with [facility] called for clarification on the Rx for rivaroxaban (XARELTO) 10 MG TABS tablet. Requests call back for clarification of Rx; spoke with Annabelle Harman at [facility]; she requests that this be sent to office for provider review and final disposition; pt last seen by Dr Milinda Cave 01/26/18.  Reason for Disposition . Pharmacy calling with prescription questions and triager unable to answer question  Answer Assessment - Initial Assessment Questions 1. SYMPTOMS: "Do you have any symptoms?"     No  2. SEVERITY: If symptoms are present, ask "Are they mild, moderate or severe?"     n/a  Protocols used: MEDICATION QUESTION CALL-A-AH

## 2018-05-17 ENCOUNTER — Other Ambulatory Visit: Payer: Self-pay | Admitting: Family Medicine

## 2018-08-10 ENCOUNTER — Encounter: Payer: Self-pay | Admitting: Family Medicine

## 2018-08-10 ENCOUNTER — Other Ambulatory Visit: Payer: Self-pay

## 2018-08-10 ENCOUNTER — Ambulatory Visit (INDEPENDENT_AMBULATORY_CARE_PROVIDER_SITE_OTHER): Payer: Self-pay | Admitting: Family Medicine

## 2018-08-10 VITALS — BP 96/61 | HR 72 | Temp 97.9°F | Resp 16 | Ht 71.5 in | Wt 203.4 lb

## 2018-08-10 DIAGNOSIS — R7303 Prediabetes: Secondary | ICD-10-CM

## 2018-08-10 DIAGNOSIS — Z720 Tobacco use: Secondary | ICD-10-CM

## 2018-08-10 DIAGNOSIS — Z0000 Encounter for general adult medical examination without abnormal findings: Secondary | ICD-10-CM

## 2018-08-10 DIAGNOSIS — I2699 Other pulmonary embolism without acute cor pulmonale: Secondary | ICD-10-CM

## 2018-08-10 DIAGNOSIS — Z7901 Long term (current) use of anticoagulants: Secondary | ICD-10-CM

## 2018-08-10 NOTE — Patient Instructions (Signed)

## 2018-08-10 NOTE — Progress Notes (Signed)
Office Note 08/10/2018  CC:  Chief Complaint  Patient presents with  . Annual Exam    pt is not fasting, had monster energy drink    HPI:  Marvin Escobar is a 43 y.o. male who is here for annual health maintenance exam. Feeling well.  No complaints. He does still smoke and is not contemplating quitting.  He is active and eats healthy. [place].  Past Medical History:  Diagnosis Date  . Prediabetes 07/2017   Fasting gluc 109; A1c 6.0%.  . Recurrent pulmonary embolism (HCC) 2011 and 2018   Negative hypercoagulable w/u 2011.  06/24/16 D-dimer norjmal suggesting risk for clot next 1 year is low, but given FH and recurrence--Xarelto indefinitely per pulm (most recent f/u visit 06/2016.)  D dimer neg 01/2018-->pt may decrease to xarelto 10mg  qd dose and needs to be on this indefinitely.  . Tobacco abuse     History reviewed. No pertinent surgical history.  Family History  Problem Relation Age of Onset  . Hypertension Mother   . Diabetes Mother   . Hyperlipidemia Mother   . Miscarriages / [place] Mother   . Angina Father     Social History   Socioeconomic History  . Marital status: Married    Spouse name: Not on file  . Number of children: Not on file  . Years of education: Not on file  . Highest education level: Not on file  Occupational History  . 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 Financial resource strain: Not on file  . Food insecurity    Worry: Not on file    Inability: Not on file  . Transportation needs    Medical: Not on file    Non-medical: Not on file  Tobacco Use  . Smoking status: Current Every Day Smoker    Packs/day: 1.00    Years: 17.00    Pack years: 17.00    Types: Cigarettes  . Smokeless tobacco: Never Used  . Tobacco comment: 1/2 ppd 06/23/2016 ee  Substance and Sexual Activity  . Alcohol use: No    Alcohol/week: 0.0 standard drinks  . Drug use: No  . Sexual activity: Not  on file  Lifestyle  . Physical activity    Days per week: Not on file    Minutes per session: Not on file  . Stress: Not on file  Relationships  . Social Musicianconnections    Talks on phone: Not on file    Gets together: Not on file    Attends religious service: Not on file    Active member of club or organization: Not on file    Attends meetings of clubs or organizations: Not on file    Relationship status: Not on file  . Intimate partner violence    Fear of current or ex partner: Not on file    Emotionally abused: Not on file    Physically abused: Not on file    Forced sexual activity: Not on file  Other Topics Concern  . Not on file  Social History Narrative   Married, 1 daughter and 2 sons.   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   Tobacco: 20 pack yr hx-->current as of 07/2017.   Alcohol: rare.   [place]ig from [place], moved to [place] around 1998.   [place] since that time.    Outpatient Medications Prior to Visit  Medication Sig Dispense Refill  . XARELTO 10  MG TABS tablet TAKE 1 TABLET EVERY DAY 90 tablet 0   No facility-administered medications prior to visit.     No Known Allergies  ROS Review of Systems  Constitutional: Negative for appetite change, chills, fatigue and fever.  HENT: Negative for congestion, dental problem, ear pain and sore throat.   Eyes: Negative for discharge, redness and visual disturbance.  Respiratory: Negative for cough, chest tightness, shortness of breath and wheezing.   Cardiovascular: Negative for chest pain, palpitations and leg swelling.  Gastrointestinal: Negative for abdominal pain, blood in stool, diarrhea, nausea and vomiting.  Genitourinary: Negative for difficulty urinating, dysuria, flank pain, frequency, hematuria and urgency.  Musculoskeletal: Negative for arthralgias, back pain, joint swelling, myalgias and neck stiffness.  Skin: Negative for pallor and rash.  Neurological: Negative for dizziness, speech difficulty, weakness and  headaches.  Hematological: Negative for adenopathy. Does not bruise/bleed easily.  Psychiatric/Behavioral: Negative for confusion and sleep disturbance. The patient is not nervous/anxious.     PE; Blood pressure 96/61, pulse 72, temperature 97.9 F (36.6 C), temperature source Temporal, resp. rate 16, height 5' 11.5" (1.816 m), weight 203 lb 6.4 oz (92.3 kg), SpO2 96 %. Body mass index is 27.97 kg/m.  Gen: Alert, well appearing.  Patient is oriented to person, place, time, and situation. AFFECT: pleasant, lucid thought and speech. ENT: Ears: EACs clear, normal epithelium.  TMs with good light reflex and landmarks bilaterally.  Eyes: no injection, icteris, swelling, or exudate.  EOMI, PERRLA. Nose: no drainage or turbinate edema/swelling.  No injection or focal lesion.  Mouth: lips without lesion/swelling.  Oral mucosa pink and moist.  Dentition intact and without obvious caries or gingival swelling.  Oropharynx without erythema, exudate, or swelling.  Neck: supple/nontender.  No LAD, mass, or TM.  Carotid pulses 2+ bilaterally, without bruits. CV: RRR, no m/r/g.   LUNGS: CTA bilat, nonlabored resps, good aeration in all lung fields. ABD: soft, NT, ND, BS normal.  No hepatospenomegaly or mass.  No bruits. EXT: no clubbing, cyanosis, or edema.  Musculoskeletal: no joint swelling, erythema, warmth, or tenderness.  ROM of all joints intact. Skin - no sores or suspicious lesions or rashes or color changes   Pertinent labs:  Lab Results  Component Value Date   TSH 1.54 07/28/2017   Lab Results  Component Value Date   WBC 10.6 (H) 01/26/2018   HGB 16.1 01/26/2018   HCT 47.3 01/26/2018   MCV 87.4 01/26/2018   PLT 210.0 01/26/2018   Lab Results  Component Value Date   CREATININE 1.27 01/26/2018   BUN 17 01/26/2018   NA 138 01/26/2018   K 4.5 01/26/2018   CL 102 01/26/2018   CO2 32 01/26/2018   Lab Results  Component Value Date   ALT 25 07/28/2017   AST 22 07/28/2017   ALKPHOS  71 07/28/2017   BILITOT 0.5 07/28/2017   Lab Results  Component Value Date   CHOL 178 07/28/2017   Lab Results  Component Value Date   HDL 44.60 07/28/2017   Lab Results  Component Value Date   LDLCALC 117 (H) 07/28/2017   Lab Results  Component Value Date   TRIG 85.0 07/28/2017   Lab Results  Component Value Date   CHOLHDL 4 07/28/2017   Lab Results  Component Value Date   HGBA1C 5.9 01/26/2018    ASSESSMENT AND PLAN:   Health maintenance exam: Reviewed age and gender appropriate health maintenance issues (prudent diet, regular exercise, health risks of tobacco and excessive alcohol,  use of seatbelts, fire alarms in home, use of sunscreen).  Also reviewed age and gender appropriate health screening as well as vaccine recommendations. Vaccines: all UTD. Labs: fasting HP + HbA1c. Prostate ca screening: average risk patient= as per latest guidelines, start screening at 5750 yrs of age. Colon ca screening: average risk patient= as per latest guidelines, start screening at 6850 yrs of age. Tob dependence: encouraged complete cessation but he is not contemplating cessation attempt at this time.  An After Visit Summary was printed and given to the patient.  FOLLOW UP:  Return in about 1 year (around 08/10/2019) for annual CPE (fasting).  Signed:  Santiago BumpersPhil Keyonte Cookston, MD           08/10/2018

## 2018-08-17 ENCOUNTER — Other Ambulatory Visit: Payer: Self-pay | Admitting: Family Medicine

## 2018-08-17 ENCOUNTER — Other Ambulatory Visit: Payer: Self-pay

## 2018-08-17 ENCOUNTER — Ambulatory Visit (INDEPENDENT_AMBULATORY_CARE_PROVIDER_SITE_OTHER): Payer: Self-pay | Admitting: Family Medicine

## 2018-08-17 DIAGNOSIS — Z7901 Long term (current) use of anticoagulants: Secondary | ICD-10-CM

## 2018-08-17 DIAGNOSIS — Z720 Tobacco use: Secondary | ICD-10-CM

## 2018-08-17 DIAGNOSIS — I2699 Other pulmonary embolism without acute cor pulmonale: Secondary | ICD-10-CM

## 2018-08-17 DIAGNOSIS — R7303 Prediabetes: Secondary | ICD-10-CM

## 2018-08-17 LAB — COMPREHENSIVE METABOLIC PANEL
ALT: 20 U/L (ref 0–53)
AST: 18 U/L (ref 0–37)
Albumin: 4.5 g/dL (ref 3.5–5.2)
Alkaline Phosphatase: 65 U/L (ref 39–117)
BUN: 13 mg/dL (ref 6–23)
CO2: 31 mEq/L (ref 19–32)
Calcium: 9.7 mg/dL (ref 8.4–10.5)
Chloride: 102 mEq/L (ref 96–112)
Creatinine, Ser: 1.42 mg/dL (ref 0.40–1.50)
GFR: 54.65 mL/min — ABNORMAL LOW (ref >60.00)
Glucose, Bld: 97 mg/dL (ref 70–99)
Potassium: 5 mEq/L (ref 3.5–5.1)
Sodium: 140 mEq/L (ref 135–145)
Total Bilirubin: 0.7 mg/dL (ref 0.2–1.2)
Total Protein: 6.7 g/dL (ref 6.0–8.3)

## 2018-08-17 LAB — CBC WITH DIFFERENTIAL/PLATELET
Basophils Absolute: 0.1 10*3/uL (ref 0.0–0.1)
Basophils Relative: 1.4 % (ref 0.0–3.0)
Eosinophils Absolute: 0.1 10*3/uL (ref 0.0–0.7)
Eosinophils Relative: 2.2 % (ref 0.0–5.0)
HCT: 48.5 % (ref 39.0–52.0)
Hemoglobin: 16 g/dL (ref 13.0–17.0)
Lymphocytes Relative: 32.1 % (ref 12.0–46.0)
Lymphs Abs: 2 10*3/uL (ref 0.7–4.0)
MCHC: 33 g/dL (ref 30.0–36.0)
MCV: 89.5 fl (ref 78.0–100.0)
Monocytes Absolute: 0.4 10*3/uL (ref 0.1–1.0)
Monocytes Relative: 6.4 % (ref 3.0–12.0)
Neutro Abs: 3.6 10*3/uL (ref 1.4–7.7)
Neutrophils Relative %: 57.9 % (ref 43.0–77.0)
Platelets: 210 10*3/uL (ref 150.0–400.0)
RBC: 5.42 Mil/uL (ref 4.22–5.81)
RDW: 13.9 % (ref 11.5–15.5)
WBC: 6.1 10*3/uL (ref 4.0–10.5)

## 2018-08-17 LAB — LIPID PANEL
Cholesterol: 206 mg/dL — ABNORMAL HIGH (ref 0–200)
HDL: 51.7 mg/dL (ref >39.00)
LDL Cholesterol: 137 mg/dL — ABNORMAL HIGH (ref 0–99)
NonHDL: 154.45
Total CHOL/HDL Ratio: 4
Triglycerides: 87 mg/dL (ref 0.0–149.0)
VLDL: 17.4 mg/dL (ref 0.0–40.0)

## 2018-08-17 LAB — TSH: TSH: 1.38 u[IU]/mL (ref 0.35–4.50)

## 2018-08-17 LAB — HEMOGLOBIN A1C: Hgb A1c MFr Bld: 6 % (ref 4.6–6.5)

## 2018-08-19 ENCOUNTER — Telehealth: Payer: Self-pay

## 2018-08-19 NOTE — Telephone Encounter (Signed)
MyChart message sent with results.  

## 2019-03-10 ENCOUNTER — Other Ambulatory Visit: Payer: Self-pay | Admitting: Family Medicine

## 2019-03-11 MED ORDER — RIVAROXABAN 10 MG PO TABS: 10.0000 mg | ORAL_TABLET | Freq: Every day | ORAL | 1 refills | Status: DC

## 2019-05-19 ENCOUNTER — Ambulatory Visit: Payer: Self-pay | Attending: Internal Medicine

## 2019-05-19 DIAGNOSIS — Z23 Encounter for immunization: Secondary | ICD-10-CM

## 2019-05-19 NOTE — Progress Notes (Signed)
   Covid-19 Vaccination Clinic  Name:  Marvin Escobar    MRN: [id number] DOB: 03-31-1975  05/19/2019  [name] was observed post Covid-19 immunization for 15 minutes without incident. He was provided with Vaccine Information Sheet and instruction to access the V-Safe system.   [name] was instructed to call 911 with any severe reactions post vaccine: Marland Kitchen Difficulty breathing  . Swelling of face and throat  . A fast heartbeat  . A bad rash all over body  . Dizziness and weakness   Immunizations Administered    Name Date Dose VIS Date Route   Pfizer COVID-19 Vaccine 05/19/2019  9:13 AM 0.3 mL 01/28/2019 Intramuscular   Manufacturer: [employer], [employer]   Lot: [id number]   NDC: [address]-3

## 2019-06-13 ENCOUNTER — Ambulatory Visit: Payer: Self-pay | Attending: Internal Medicine

## 2019-06-13 DIAGNOSIS — Z23 Encounter for immunization: Secondary | ICD-10-CM

## 2019-06-13 NOTE — Progress Notes (Signed)
   Covid-19 Vaccination Clinic  Name:  Zebastian Carico    MRN: [id number] DOB: 11/23/1975  06/13/2019  Mr. Gallien was observed post Covid-19 immunization for 15 minutes without incident. He was provided with Vaccine Information Sheet and instruction to access the V-Safe system.   Mr. Vandervliet was instructed to call 911 with any severe reactions post vaccine: Marland Kitchen Difficulty breathing  . Swelling of face and throat  . A fast heartbeat  . A bad rash all over body  . Dizziness and weakness   Immunizations Administered    Name Date Dose VIS Date Route   Pfizer COVID-19 Vaccine 06/13/2019  8:45 AM 0.3 mL 04/13/2018 Intramuscular   Manufacturer: [employer], [employer]   Lot: [id number]   NDC: [address]-7

## 2019-10-12 ENCOUNTER — Encounter: Payer: Self-pay | Admitting: Family Medicine

## 2019-10-12 ENCOUNTER — Other Ambulatory Visit: Payer: Self-pay

## 2019-10-12 ENCOUNTER — Ambulatory Visit (INDEPENDENT_AMBULATORY_CARE_PROVIDER_SITE_OTHER): Payer: Self-pay | Admitting: Family Medicine

## 2019-10-12 VITALS — BP 105/64 | HR 71 | Temp 98.8°F | Resp 16 | Ht 71.5 in | Wt 204.4 lb

## 2019-10-12 DIAGNOSIS — F17200 Nicotine dependence, unspecified, uncomplicated: Secondary | ICD-10-CM

## 2019-10-12 DIAGNOSIS — Z8042 Family history of malignant neoplasm of prostate: Secondary | ICD-10-CM

## 2019-10-12 DIAGNOSIS — R7301 Impaired fasting glucose: Secondary | ICD-10-CM

## 2019-10-12 DIAGNOSIS — Z125 Encounter for screening for malignant neoplasm of prostate: Secondary | ICD-10-CM

## 2019-10-12 DIAGNOSIS — Z0000 Encounter for general adult medical examination without abnormal findings: Secondary | ICD-10-CM

## 2019-10-12 DIAGNOSIS — R413 Other amnesia: Secondary | ICD-10-CM

## 2019-10-12 MED ORDER — RIVAROXABAN 10 MG PO TABS
10.0000 mg | ORAL_TABLET | Freq: Every day | ORAL | 3 refills | Status: DC
Start: 2019-10-12 — End: 2021-01-04

## 2019-10-12 NOTE — Patient Instructions (Signed)

## 2019-10-12 NOTE — Progress Notes (Signed)
Office Note 10/12/2019  CC:  Chief Complaint  Patient presents with  . Annual Exam    pt is not fasting   HPI:  Marvin Escobar is a 44 y.o. male who is here for annual health maintenance exam. Feeling good. Active but no formal exercise. Eating poorly, too much fast food. He tells me his dad has prostate ca at age 21.  Pt did not know this until about 2 wks ago.  He complaints of some decreased ability to remember things and keep up with schedules like he used to.  Has to write reminders down for himself more now.   Noted this for about the last year.  No signif anxiety or depressed mood. Has always been very busy, no change over the last couple years. No alcohol or drugs.  No herbal/otc meds. He still smokes cigs. Drinks several caffeinated drinks per day.  Gets adequate sleep.  Past Medical History:  Diagnosis Date  . Prediabetes 07/2017   Fasting gluc 109; A1c 6.0%.  . Recurrent pulmonary embolism (HCC) 2011 and 2018   Negative hypercoagulable w/u 2011.  06/24/16 D-dimer norjmal suggesting risk for clot next 1 year is low, but given FH and recurrence--Xarelto indefinitely per pulm (most recent f/u visit 06/2016.)  D dimer neg 01/2018-->pt may decrease to xarelto 10mg  qd dose and needs to be on this indefinitely.  . Tobacco abuse     History reviewed. No pertinent surgical history.  Family History  Problem Relation Age of Onset  . Hypertension Mother   . Diabetes Mother   . Hyperlipidemia Mother   . Miscarriages / Mother   . Angina Father     Social History   Socioeconomic History  . Marital status: Married    Spouse name: Not on file  . Number of children: Not on file  . Years of education: Not on file  . Highest education level: Not on file  Occupational History  . Occupation: [place]    CommentPsychologist, sport and exercise  Tobacco Use  . Smoking status: Current Every Day Smoker    Packs/day: 1.00    Years: 17.00    Pack years: 17.00    Types:  Cigarettes  . Smokeless tobacco: Never Used  . Tobacco comment: 1/2 ppd 06/23/2016 ee  Vaping Use  . Vaping Use: Never used  Substance and Sexual Activity  . Alcohol use: No    Alcohol/week: 0.0 standard drinks  . Drug use: No  . Sexual activity: Not on file  Other Topics Concern  . Not on file  Social History Narrative   Married, 1 daughter and 2 sons.   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   Tobacco: 20 pack yr hx-->current as of 07/2017.   Alcohol: rare.   Orig from [place], moved to [place] around 1998.   [place] since that time.   Social Determinants of Health   Financial Resource Strain:   . Difficulty of Paying Living Expenses: Not on file  Food Insecurity:   . Worried About 1999 in the Last Year: Not on file  . Ran Out of Food in the Last Year: Not on file  Transportation Needs:   . Lack of Transportation (Medical): Not on file  . Lack of Transportation (Non-Medical): Not on file  Physical Activity:   . Days of Exercise per Week: Not on file  . Minutes of Exercise per Session: Not on file  Stress:   . Feeling of Stress : Not on  file  Social Connections:   . Frequency of Communication with Friends and Family: Not on file  . Frequency of Social Gatherings with Friends and Family: Not on file  . Attends Religious Services: Not on file  . Active Member of Clubs or Organizations: Not on file  . Attends Banker Meetings: Not on file  . Marital Status: Not on file  Intimate Partner Violence:   . Fear of Current or Ex-Partner: Not on file  . Emotionally Abused: Not on file  . Physically Abused: Not on file  . Sexually Abused: Not on file    Outpatient Medications Prior to Visit  Medication Sig Dispense Refill  . rivaroxaban (XARELTO) 10 MG TABS tablet Take 1 tablet (10 mg total) by mouth daily. 90 tablet 1   No facility-administered medications prior to visit.    No Known Allergies  ROS Review of Systems  Constitutional: Negative for  appetite change, chills, fatigue and fever.  HENT: Negative for congestion, dental problem, ear pain and sore throat.   Eyes: Negative for discharge, redness and visual disturbance.  Respiratory: Negative for cough, chest tightness, shortness of breath and wheezing.   Cardiovascular: Negative for chest pain, palpitations and leg swelling.  Gastrointestinal: Negative for abdominal pain, blood in stool, diarrhea, nausea and vomiting.  Genitourinary: Negative for difficulty urinating, dysuria, flank pain, frequency, hematuria and urgency.  Musculoskeletal: Negative for arthralgias, back pain, joint swelling, myalgias and neck stiffness.  Skin: Negative for pallor and rash.  Neurological: Negative for dizziness, speech difficulty, weakness and headaches.  Hematological: Negative for adenopathy. Does not bruise/bleed easily.  Psychiatric/Behavioral: Negative for confusion and sleep disturbance. The patient is not nervous/anxious.     PE; Blood pressure 105/64, pulse 71, temperature 98.8 F (37.1 C), temperature source Temporal, resp. rate 16, height 5' 11.5" (1.816 m), weight 204 lb 6.4 oz (92.7 kg), SpO2 96 %. Gen: Alert, well appearing.  Patient is oriented to person, place, time, and situation. AFFECT: pleasant, lucid thought and speech. ENT: Ears: EACs clear, normal epithelium.  TMs with good light reflex and landmarks bilaterally.  Eyes: no injection, icteris, swelling, or exudate.  EOMI, PERRLA. Nose: no drainage or turbinate edema/swelling.  No injection or focal lesion.  Mouth: lips without lesion/swelling.  Oral mucosa pink and moist.  Dentition intact and without obvious caries or gingival swelling.  Oropharynx without erythema, exudate, or swelling.  Neck: supple/nontender.  No LAD, mass, or TM.  Carotid pulses 2+ bilaterally, without bruits. CV: RRR, no m/r/g.   LUNGS: CTA bilat, nonlabored resps, good aeration in all lung fields. ABD: soft, NT, ND, BS normal.  No hepatospenomegaly or  mass.  No bruits. EXT: no clubbing, cyanosis, or edema.  Musculoskeletal: no joint swelling, erythema, warmth, or tenderness.  ROM of all joints intact. Skin - no sores or suspicious lesions or rashes or color changes Rectal exam: negative without mass, lesions or tenderness, PROSTATE EXAM: smooth and symmetric without nodules or tenderness.  Pertinent labs:  Lab Results  Component Value Date   TSH 1.38 08/17/2018   Lab Results  Component Value Date   WBC 6.1 08/17/2018   HGB 16.0 08/17/2018   HCT 48.5 08/17/2018   MCV 89.5 08/17/2018   PLT 210.0 08/17/2018   Lab Results  Component Value Date   CREATININE 1.42 08/17/2018   BUN 13 08/17/2018   NA 140 08/17/2018   K 5.0 08/17/2018   CL 102 08/17/2018   CO2 31 08/17/2018   Lab Results  Component  Value Date   ALT 20 08/17/2018   AST 18 08/17/2018   ALKPHOS 65 08/17/2018   BILITOT 0.7 08/17/2018   Lab Results  Component Value Date   CHOL 206 (H) 08/17/2018   Lab Results  Component Value Date   HDL 51.70 08/17/2018   Lab Results  Component Value Date   LDLCALC 137 (H) 08/17/2018   Lab Results  Component Value Date   TRIG 87.0 08/17/2018   Lab Results  Component Value Date   CHOLHDL 4 08/17/2018   Lab Results  Component Value Date   HGBA1C 6.0 08/17/2018   ASSESSMENT AND PLAN:   1) 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  Likely from leading a very busy work/home life and getting a little older. No rx of otc meds recommended. Signs/symptoms to call or return for were reviewed and pt expressed understanding.  2) Health maintenance exam: Reviewed age and gender appropriate health maintenance issues (prudent diet, regular exercise, health risks of tobacco and excessive alcohol, use of seatbelts, fire alarms in home, use of sunscreen).  Also reviewed age and gender appropriate health screening as well as vaccine recommendations. Vaccines: Tdap UTD.   Labs: fasting HP + Hba1c (IFG) ordered today. Prostate ca  screening: +FH of prost ca in father--DRE normal today, PSA ordered. Colon ca screening: average risk patient= as per latest guidelines, start screening at 30 yrs of age.  An After Visit Summary was printed and given to the patient.  FOLLOW UP:  Return in about 1 year (around 10/11/2020) for annual CPE (fasting).  Signed:  Santiago Bumpers, MD           10/12/2019

## 2019-10-17 ENCOUNTER — Ambulatory Visit (INDEPENDENT_AMBULATORY_CARE_PROVIDER_SITE_OTHER): Payer: Self-pay | Admitting: Family Medicine

## 2019-10-17 ENCOUNTER — Other Ambulatory Visit: Payer: Self-pay

## 2019-10-17 DIAGNOSIS — Z125 Encounter for screening for malignant neoplasm of prostate: Secondary | ICD-10-CM

## 2019-10-17 DIAGNOSIS — Z8042 Family history of malignant neoplasm of prostate: Secondary | ICD-10-CM

## 2019-10-17 DIAGNOSIS — R7301 Impaired fasting glucose: Secondary | ICD-10-CM

## 2019-10-17 LAB — CBC WITH DIFFERENTIAL/PLATELET
Basophils Absolute: 0.1 10*3/uL (ref 0.0–0.1)
Basophils Relative: 1.1 % (ref 0.0–3.0)
Eosinophils Absolute: 0.1 10*3/uL (ref 0.0–0.7)
Eosinophils Relative: 2 % (ref 0.0–5.0)
HCT: 44.8 % (ref 39.0–52.0)
Hemoglobin: 15.4 g/dL (ref 13.0–17.0)
Lymphocytes Relative: 33.2 % (ref 12.0–46.0)
Lymphs Abs: 2 10*3/uL (ref 0.7–4.0)
MCHC: 34.3 g/dL (ref 30.0–36.0)
MCV: 87.2 fl (ref 78.0–100.0)
Monocytes Absolute: 0.4 10*3/uL (ref 0.1–1.0)
Monocytes Relative: 7.2 % (ref 3.0–12.0)
Neutro Abs: 3.4 10*3/uL (ref 1.4–7.7)
Neutrophils Relative %: 56.5 % (ref 43.0–77.0)
Platelets: 212 10*3/uL (ref 150.0–400.0)
RBC: 5.14 Mil/uL (ref 4.22–5.81)
RDW: 13.7 % (ref 11.5–15.5)
WBC: 6 10*3/uL (ref 4.0–10.5)

## 2019-10-17 LAB — COMPREHENSIVE METABOLIC PANEL
ALT: 26 U/L (ref 0–53)
AST: 23 U/L (ref 0–37)
Albumin: 4.3 g/dL (ref 3.5–5.2)
Alkaline Phosphatase: 62 U/L (ref 39–117)
BUN: 20 mg/dL (ref 6–23)
CO2: 29 mEq/L (ref 19–32)
Calcium: 9.6 mg/dL (ref 8.4–10.5)
Chloride: 102 mEq/L (ref 96–112)
Creatinine, Ser: 1.36 mg/dL (ref 0.40–1.50)
GFR: 57.12 mL/min — ABNORMAL LOW (ref >60.00)
Glucose, Bld: 100 mg/dL — ABNORMAL HIGH (ref 70–99)
Potassium: 4.2 mEq/L (ref 3.5–5.1)
Sodium: 140 mEq/L (ref 135–145)
Total Bilirubin: 0.6 mg/dL (ref 0.2–1.2)
Total Protein: 6.5 g/dL (ref 6.0–8.3)

## 2019-10-17 LAB — HEMOGLOBIN A1C: Hgb A1c MFr Bld: 6.1 % (ref 4.6–6.5)

## 2019-10-17 LAB — PSA: PSA: 0.44 ng/mL (ref 0.10–4.00)

## 2019-10-17 LAB — LIPID PANEL
Cholesterol: 202 mg/dL — ABNORMAL HIGH (ref 0–200)
HDL: 52.8 mg/dL (ref >39.00)
LDL Cholesterol: 128 mg/dL — ABNORMAL HIGH (ref 0–99)
NonHDL: 149.45
Total CHOL/HDL Ratio: 4
Triglycerides: 106 mg/dL (ref 0.0–149.0)
VLDL: 21.2 mg/dL (ref 0.0–40.0)

## 2019-10-17 LAB — TSH: TSH: 1.83 u[IU]/mL (ref 0.35–4.50)

## 2021-01-04 ENCOUNTER — Other Ambulatory Visit: Payer: Self-pay | Admitting: Family Medicine

## 2021-02-20 ENCOUNTER — Other Ambulatory Visit: Payer: Self-pay

## 2021-02-20 ENCOUNTER — Ambulatory Visit (INDEPENDENT_AMBULATORY_CARE_PROVIDER_SITE_OTHER): Payer: Self-pay | Admitting: Family Medicine

## 2021-02-20 ENCOUNTER — Encounter: Payer: Self-pay | Admitting: Family Medicine

## 2021-02-20 VITALS — BP 104/66 | HR 65 | Temp 97.6°F | Ht 72.0 in | Wt 199.6 lb

## 2021-02-20 DIAGNOSIS — Z13220 Encounter for screening for lipoid disorders: Secondary | ICD-10-CM

## 2021-02-20 DIAGNOSIS — Z7901 Long term (current) use of anticoagulants: Secondary | ICD-10-CM

## 2021-02-20 DIAGNOSIS — Z23 Encounter for immunization: Secondary | ICD-10-CM

## 2021-02-20 DIAGNOSIS — Z0000 Encounter for general adult medical examination without abnormal findings: Secondary | ICD-10-CM

## 2021-02-20 DIAGNOSIS — F17210 Nicotine dependence, cigarettes, uncomplicated: Secondary | ICD-10-CM

## 2021-02-20 DIAGNOSIS — I2699 Other pulmonary embolism without acute cor pulmonale: Secondary | ICD-10-CM

## 2021-02-20 DIAGNOSIS — K5900 Constipation, unspecified: Secondary | ICD-10-CM

## 2021-02-20 DIAGNOSIS — Z131 Encounter for screening for diabetes mellitus: Secondary | ICD-10-CM

## 2021-02-20 MED ORDER — BUPROPION HCL ER (XL) 150 MG PO TB24: 150.0000 mg | ORAL_TABLET | Freq: Every day | ORAL | 1 refills | Status: DC

## 2021-02-20 MED ORDER — RIVAROXABAN 10 MG PO TABS: 10.0000 mg | ORAL_TABLET | Freq: Every day | ORAL | 3 refills | Status: DC

## 2021-02-20 NOTE — Addendum Note (Signed)
Addended by: Emi Holes D on: 02/20/2021 11:41 AM   Modules accepted: Orders

## 2021-02-20 NOTE — Patient Instructions (Addendum)
Buy over the counter senakot-S (generic is fine) and take 2 tabs daily as needed for constipation.   Health Maintenance, Male Adopting a healthy lifestyle and getting preventive care are important in promoting health and wellness. Ask your health care provider about: The right schedule for you to have regular tests and exams. Things you can do on your own to prevent diseases and keep yourself healthy. What should I know about diet, weight, and exercise? Eat a healthy diet  Eat a diet that includes plenty of vegetables, fruits, low-fat dairy products, and lean protein. Do not eat a lot of foods that are high in solid fats, added sugars, or sodium. Maintain a healthy weight Body mass index (BMI) is a measurement that can be used to identify possible weight problems. It estimates body fat based on height and weight. Your health care provider can help determine your BMI and help you achieve or maintain a healthy weight. Get regular exercise Get regular exercise. This is one of the most important things you can do for your health. Most adults should: Exercise for at least 150 minutes each week. The exercise should increase your heart rate and make you sweat (moderate-intensity exercise). Do strengthening exercises at least twice a week. This is in addition to the moderate-intensity exercise. Spend less time sitting. Even light physical activity can be beneficial. Watch cholesterol and blood lipids Have your blood tested for lipids and cholesterol at 45 years of age, then have this test every 5 years. You may need to have your cholesterol levels checked more often if: Your lipid or cholesterol levels are high. You are older than 45 years of age. You are at high risk for heart disease. What should I know about cancer screening? Many types of cancers can be detected early and may often be prevented. Depending on your health history and family history, you may need to have cancer screening at  various ages. This may include screening for: Colorectal cancer. Prostate cancer. Skin cancer. Lung cancer. What should I know about heart disease, diabetes, and high blood pressure? Blood pressure and heart disease High blood pressure causes heart disease and increases the risk of stroke. This is more likely to develop in people who have high blood pressure readings or are overweight. Talk with your health care provider about your target blood pressure readings. Have your blood pressure checked: Every 3-5 years if you are 45-45 years of age. Every year if you are 45 years old or older. If you are between the ages of 60 and 71 and are a current or former smoker, ask your health care provider if you should have a one-time screening for abdominal aortic aneurysm (AAA). Diabetes Have regular diabetes screenings. This checks your fasting blood sugar level. Have the screening done: Once every three years after age 45 if you are at a normal weight and have a low risk for diabetes. More often and at a younger age if you are overweight or have a high risk for diabetes. What should I know about preventing infection? Hepatitis B If you have a higher risk for hepatitis B, you should be screened for this virus. Talk with your health care provider to find out if you are at risk for hepatitis B infection. Hepatitis C Blood testing is recommended for: Everyone born from [birth year] through 1964. Anyone with known risk factors for hepatitis C. Sexually transmitted infections (STIs) You should be screened each year for STIs, including gonorrhea and chlamydia, if: You are sexually  active and are younger than 45 years of age. You are older than 45 years of age and your health care provider tells you that you are at risk for this type of infection. Your sexual activity has changed since you were last screened, and you are at increased risk for chlamydia or gonorrhea. Ask your health care provider if you are at  risk. Ask your health care provider about whether you are at high risk for HIV. Your health care provider may recommend a prescription medicine to help prevent HIV infection. If you choose to take medicine to prevent HIV, you should first get tested for HIV. You should then be tested every 3 months for as long as you are taking the medicine. Follow these instructions at home: Alcohol use Do not drink alcohol if your health care provider tells you not to drink. If you drink alcohol: Limit how much you have to 0-2 drinks a day. Know how much alcohol is in your drink. In the [place] one drink equals one 12 oz bottle of beer (355 mL), one 5 oz glass of wine (148 mL), or one 1 oz glass of hard liquor (44 mL). Lifestyle Do not use any products that contain nicotine or tobacco. These products include cigarettes, chewing tobacco, and vaping devices, such as e-cigarettes. If you need help quitting, ask your health care provider. Do not use street drugs. Do not share needles. Ask your health care provider for help if you need support or information about quitting drugs. General instructions Schedule regular health, dental, and eye exams. Stay current with your vaccines. Tell your health care provider if: You often feel depressed. You have ever been abused or do not feel safe at home. Summary Adopting a healthy lifestyle and getting preventive care are important in promoting health and wellness. Follow your health care provider's instructions about healthy diet, exercising, and getting tested or screened for diseases. Follow your health care provider's instructions on monitoring your cholesterol and blood pressure. This information is not intended to replace advice given to you by your health care provider. Make sure you discuss any questions you have with your health care provider. Document Revised: 06/25/2020 Document Reviewed: 06/25/2020 Elsevier Patient Education  2022 [employer].

## 2021-02-20 NOTE — Progress Notes (Signed)
Office Note 02/20/2021  CC:  Chief Complaint  Patient presents with   Follow-up    HPI:  Patient is a 46 y.o. male who is here for annual health maintenance exam and f/u hx of recurrent PE and prediabetes. I last saw him in August 2021. A/P as of last visit: "1) 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  Likely from leading a very busy work/home life and getting a little older. No rx of otc meds recommended. Signs/symptoms to call or return for were reviewed and pt expressed understanding.   2) Health maintenance exam: Reviewed age and gender appropriate health maintenance issues (prudent diet, regular exercise, health risks of tobacco and excessive alcohol, use of seatbelts, fire alarms in home, use of sunscreen).  Also reviewed age and gender appropriate health screening as well as vaccine recommendations. Vaccines: Tdap UTD.   Labs: fasting HP + Hba1c (IFG) ordered today. Prostate ca screening: +FH of prost ca in father--DRE normal today, PSA ordered. Colon ca screening: average risk patient= as per latest guidelines, start screening at 32 yrs of age."  INTERIM HX: Has noted some constipation relatively recently.  About 2 months ago noted he had started having to strain a lot and his stool was large and hard.  It hurt to get the bowel movement out.  No blood.  No tearing sensation.  No dietary changes prior.  He has not tried any over-the-counter medicines. Over the last couple weeks this has significantly improved.  He does drink lots of energy drinks and sodas.  Has been taking Xarelto daily as prescribed.  No shortness of breath.  No leg pains or swelling.  Has long history of smoking cigarettes.  Maximum duration of quitting over the last few years has been 6 months.  Most recently tried Chantix and this caused excessive nightmares. Also tried hypnosis recently and this did not work.  Past Medical History:  Diagnosis Date   Prediabetes 07/2017   Fasting gluc 109; A1c 6.0%.    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  06/24/16 D-dimer norjmal suggesting risk for clot next 1 year is low, but given FH and recurrence--Xarelto indefinitely per pulm (most recent f/u visit 06/2016.)  D dimer neg 01/2018-->pt may decrease to xarelto 10mg  qd dose and needs to be on this indefinitely.   Tobacco abuse     History reviewed. No pertinent surgical history.  Family History  Problem Relation Age of Onset   Hypertension Mother    Diabetes Mother    Hyperlipidemia Mother    Miscarriages / [place] Mother    Angina Father     Social History   Socioeconomic History   Marital status: Married    Spouse name: Not on file   Number of children: Not on file   Years of education: Not on file   Highest education level: Not on file  Occupational History   Occupation: business owner    Comment: retail store  Tobacco Use   Smoking status: Every Day    Packs/day: 1.00    Years: 17.00    Pack years: 17.00    Types: Cigarettes   Smokeless tobacco: Never   Tobacco comments:    1/2 ppd 06/23/2016 ee  Vaping Use   Vaping Use: Never used  Substance and Sexual Activity   Alcohol use: No    Alcohol/week: 0.0 standard drinks   Drug use: No   Sexual activity: Not on file  Other Topics Concern  Not on file  Social History Narrative   Married, 1 daughter and 2 sons.   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   Tobacco: 20 pack yr hx-->current as of 07/2017.   Alcohol: rare.   Orig from [place], moved to [place] around 1998.   [place] since that time.   Social Determinants of Health   Financial Resource Strain: Not on file  Food Insecurity: Not on file  Transportation Needs: Not on file  Physical Activity: Not on file  Stress: Not on file  Social Connections: Not on file  Intimate Partner Violence: Not on file    Outpatient Medications Prior to Visit  Medication Sig Dispense Refill   rivaroxaban (XARELTO) 10 MG TABS tablet Take 1  tablet (10 mg total) by mouth daily. OFFICE VISIT NEEDED FOR FURTHER REFILLS 30 tablet 0   No facility-administered medications prior to visit.   No Known Allergies  ROS Review of Systems  Constitutional:  Negative for appetite change, chills, fatigue and fever.  HENT:  Negative for congestion, dental problem, ear pain and sore throat.   Eyes:  Negative for discharge, redness and visual disturbance.  Respiratory:  Negative for cough, chest tightness, shortness of breath and wheezing.   Cardiovascular:  Negative for chest pain, palpitations and leg swelling.  Gastrointestinal:  Negative for abdominal pain, blood in stool, diarrhea, nausea and vomiting.  Genitourinary:  Negative for difficulty urinating, dysuria, flank pain, frequency, hematuria, scrotal swelling and urgency.  Musculoskeletal:  Negative for arthralgias, back pain, joint swelling, myalgias and neck stiffness.  Skin:  Negative for pallor and rash.  Neurological:  Positive for dizziness (occ orthostatic dizziness, brief). Negative for speech difficulty, weakness and headaches.  Hematological:  Negative for adenopathy. Does not bruise/bleed easily.  Psychiatric/Behavioral:  Negative for confusion and sleep disturbance. The patient is not nervous/anxious.    PE; Vitals with BMI 02/20/2021 10/12/2019 08/10/2018  Height 6\' 0"  5' 11.5" 5' 11.5"  Weight 199 lbs 10 oz 204 lbs 6 oz 203 lbs 6 oz  BMI 27.06 123XX123 [id number]  Systolic 123456 123456 96  Diastolic 66 64 61  Pulse 65 71 72   Gen: Alert, well appearing.  Patient is oriented to person, place, time, and situation. AFFECT: pleasant, lucid thought and speech. ENT: Ears: EACs clear, normal epithelium.  TMs with good light reflex and landmarks bilaterally.  Eyes: no injection, icteris, swelling, or exudate.  EOMI, PERRLA. Nose: no drainage or turbinate edema/swelling.  No injection or focal lesion.  Mouth: lips without lesion/swelling.  Oral mucosa pink and moist.  Dentition intact and  without obvious caries or gingival swelling.  Oropharynx without erythema, exudate, or swelling.  Neck: supple/nontender.  No LAD, mass, or TM.  Carotid pulses 2+ bilaterally, without bruits. CV: RRR, no m/r/g.   LUNGS: CTA bilat, nonlabored resps, good aeration in all lung fields. ABD: soft, NT, ND, BS normal.  No hepatospenomegaly or mass.  No bruits. EXT: no clubbing, cyanosis, or edema.  Musculoskeletal: no joint swelling, erythema, warmth, or tenderness.  ROM of all joints intact. Skin - no sores or suspicious lesions or rashes or color changes Anal exam: no fissures or hemorrhoids.  Nontender.  No rash.  Pertinent labs:  None today  ASSESSMENT AND PLAN:   #1 constipation.  Discussed increasing fluid intake and use of Senokot-S as needed.  This seems to have cleared up significantly lately.  #2 tobacco dependence.  Failed hypnosis.  Intolerant of Chantix. Will try Wellbutrin XL 150 mg a day.  #  3 Health maintenance exam: Reviewed age and gender appropriate health maintenance issues (prudent diet, regular exercise, health risks of tobacco and excessive alcohol, use of seatbelts, fire alarms in home, use of sunscreen).  Also reviewed age and gender appropriate health screening as well as vaccine recommendations. Vaccines: Tdap->given today.  Flu->UTD.   Labs: screening for HLD and DM, plus cbc to follow for chronic anticoagulation---future when fasting. Prostate ca screening: average risk patient= as per latest guidelines, start screening at 75 yrs of age. Colon ca screening: average risk patient= as per latest guidelines, start screening at 34 yrs of age.   An After Visit Summary was printed and given to the patient.  FOLLOW UP:  Return for 4-6 week f/u smoking cessation /med.  Signed:  Crissie Sickles, MD           02/20/2021

## 2021-03-11 ENCOUNTER — Other Ambulatory Visit: Payer: Self-pay

## 2021-03-11 ENCOUNTER — Ambulatory Visit (INDEPENDENT_AMBULATORY_CARE_PROVIDER_SITE_OTHER): Payer: Self-pay

## 2021-03-11 DIAGNOSIS — Z13220 Encounter for screening for lipoid disorders: Secondary | ICD-10-CM

## 2021-03-11 DIAGNOSIS — Z0000 Encounter for general adult medical examination without abnormal findings: Secondary | ICD-10-CM

## 2021-03-11 DIAGNOSIS — Z7901 Long term (current) use of anticoagulants: Secondary | ICD-10-CM

## 2021-03-11 LAB — COMPREHENSIVE METABOLIC PANEL
ALT: 24 U/L (ref 0–53)
AST: 21 U/L (ref 0–37)
Albumin: 4.1 g/dL (ref 3.5–5.2)
Alkaline Phosphatase: 63 U/L (ref 39–117)
BUN: 25 mg/dL — ABNORMAL HIGH (ref 6–23)
CO2: 28 mEq/L (ref 19–32)
Calcium: 8.9 mg/dL (ref 8.4–10.5)
Chloride: 103 mEq/L (ref 96–112)
Creatinine, Ser: 1.19 mg/dL (ref 0.40–1.50)
GFR: 74.21 mL/min (ref >60.00)
Glucose, Bld: 94 mg/dL (ref 70–99)
Potassium: 4.3 mEq/L (ref 3.5–5.1)
Sodium: 138 mEq/L (ref 135–145)
Total Bilirubin: 0.5 mg/dL (ref 0.2–1.2)
Total Protein: 6.2 g/dL (ref 6.0–8.3)

## 2021-03-11 LAB — CBC WITH DIFFERENTIAL/PLATELET
Basophils Absolute: 0.1 10*3/uL (ref 0.0–0.1)
Basophils Relative: 1 % (ref 0.0–3.0)
Eosinophils Absolute: 0.1 10*3/uL (ref 0.0–0.7)
Eosinophils Relative: 2.2 % (ref 0.0–5.0)
HCT: 43 % (ref 39.0–52.0)
Hemoglobin: 14.4 g/dL (ref 13.0–17.0)
Lymphocytes Relative: 29.6 % (ref 12.0–46.0)
Lymphs Abs: 1.6 10*3/uL (ref 0.7–4.0)
MCHC: 33.5 g/dL (ref 30.0–36.0)
MCV: 86.5 fl (ref 78.0–100.0)
Monocytes Absolute: 0.3 10*3/uL (ref 0.1–1.0)
Monocytes Relative: 5.5 % (ref 3.0–12.0)
Neutro Abs: 3.3 10*3/uL (ref 1.4–7.7)
Neutrophils Relative %: 61.7 % (ref 43.0–77.0)
Platelets: 209 10*3/uL (ref 150.0–400.0)
RBC: 4.97 Mil/uL (ref 4.22–5.81)
RDW: 13.1 % (ref 11.5–15.5)
WBC: 5.4 10*3/uL (ref 4.0–10.5)

## 2021-03-11 LAB — LIPID PANEL
Cholesterol: 183 mg/dL (ref 0–200)
HDL: 43.9 mg/dL (ref >39.00)
LDL Cholesterol: 129 mg/dL — ABNORMAL HIGH (ref 0–99)
NonHDL: 139.51
Total CHOL/HDL Ratio: 4
Triglycerides: 54 mg/dL (ref 0.0–149.0)
VLDL: 10.8 mg/dL (ref 0.0–40.0)

## 2021-03-20 ENCOUNTER — Other Ambulatory Visit: Payer: Self-pay

## 2021-03-20 ENCOUNTER — Ambulatory Visit (INDEPENDENT_AMBULATORY_CARE_PROVIDER_SITE_OTHER): Payer: Self-pay | Admitting: Family Medicine

## 2021-03-20 ENCOUNTER — Encounter: Payer: Self-pay | Admitting: Family Medicine

## 2021-03-20 VITALS — BP 99/62 | HR 65 | Temp 98.1°F | Ht 72.0 in | Wt 199.2 lb

## 2021-03-20 DIAGNOSIS — E7800 Pure hypercholesterolemia, unspecified: Secondary | ICD-10-CM

## 2021-03-20 DIAGNOSIS — Z716 Tobacco abuse counseling: Secondary | ICD-10-CM

## 2021-03-20 DIAGNOSIS — F17200 Nicotine dependence, unspecified, uncomplicated: Secondary | ICD-10-CM

## 2021-03-20 NOTE — Progress Notes (Signed)
OFFICE VISIT  03/20/2021  CC:  Chief Complaint  Patient presents with   Follow-up    Smoking cessation    HPI:    Patient is a 46 y.o. male who presents for 1 month f/u tobacco cessation encounter. A/P as of last visit: "#1 constipation.  Discussed increasing fluid intake and use of Senokot-S as needed.  This seems to have cleared up significantly lately.   #2 tobacco dependence.  Failed hypnosis.  Intolerant of Chantix. Will try Wellbutrin XL 150 mg a day.   #3 Health maintenance exam: Reviewed age and gender appropriate health maintenance issues (prudent diet, regular exercise, health risks of tobacco and excessive alcohol, use of seatbelts, fire alarms in home, use of sunscreen).  Also reviewed age and gender appropriate health screening as well as vaccine recommendations. Vaccines: Tdap->given today.  Flu->UTD.   Labs: screening for HLD and DM, plus cbc to follow for chronic anticoagulation---future when fasting. Prostate ca screening: average risk patient= as per latest guidelines, start screening at 66 yrs of age. Colon ca screening: average risk patient= as per latest guidelines, start screening at 45 yrs of age."  INTERIM HX: Labs after last visit were all normal except mildly elevated LDL, therapeutic lifestyle changes recommended.  Marvin Escobar feels well. He has not picked up the Wellbutrin yet--says he went out of town for a while but plans on starting it now. He has smoked approximately 25 years, typically a pack a day of marble lights.  Has cut back in the last few months to half a pack a day.  Most recent quit attempt was about 9 months ago--tried hypnosis.  He quit for about a 63-month duration in the summer 2021--- without any medication assistance.  We reviewed the lab results from his last visit today.  Past Medical History:  Diagnosis Date   Prediabetes 07/2017   Fasting gluc 109; A1c 6.0%.   Recurrent pulmonary embolism ([facility]) 2011 and 2018   Negative  hypercoagulable w/u 2011.  06/24/16 D-dimer norjmal suggesting risk for clot next 1 year is low, but given FH and recurrence--Xarelto indefinitely per pulm (most recent f/u visit 06/2016.)  D dimer neg 01/2018-->pt may decrease to xarelto 10mg  qd dose and needs to be on this indefinitely.   Tobacco abuse     History reviewed. No pertinent surgical history.  Outpatient Medications Prior to Visit  Medication Sig Dispense Refill   buPROPion (WELLBUTRIN XL) 150 MG 24 hr tablet Take 1 tablet (150 mg total) by mouth daily. 30 tablet 1   rivaroxaban (XARELTO) 10 MG TABS tablet Take 1 tablet (10 mg total) by mouth daily. OFFICE VISIT NEEDED FOR FURTHER REFILLS 90 tablet 3   No facility-administered medications prior to visit.    No Known Allergies  ROS As per HPI  PE: Vitals with BMI 03/20/2021 02/20/2021 10/12/2019  Height 6\' 0"  6\' 0"  5' 11.5"  Weight 199 lbs 3 oz 199 lbs 10 oz 204 lbs 6 oz  BMI 27.01 27.06 28.11  Systolic 99 104 105  Diastolic 62 66 64  Pulse 65 65 71     Physical Exam  Gen: Alert, well appearing.  Patient is oriented to person, place, time, and situation. AFFECT: pleasant, lucid thought and speech. No further exam today.  LABS:  Last CBC Lab Results  Component Value Date   WBC 5.4 03/11/2021   HGB 14.4 03/11/2021   HCT 43.0 03/11/2021   MCV 86.5 03/11/2021   RDW 13.1 03/11/2021   PLT 209.0 03/11/2021  Last metabolic panel Lab Results  Component Value Date   GLUCOSE 94 03/11/2021   NA 138 03/11/2021   K 4.3 03/11/2021   CL 103 03/11/2021   CO2 28 03/11/2021   BUN 25 (H) 03/11/2021   CREATININE 1.19 03/11/2021   CALCIUM 8.9 03/11/2021   PROT 6.2 03/11/2021   ALBUMIN 4.1 03/11/2021   BILITOT 0.5 03/11/2021   ALKPHOS 63 03/11/2021   AST 21 03/11/2021   ALT 24 03/11/2021   Last lipids Lab Results  Component Value Date   CHOL 183 03/11/2021   HDL 43.90 03/11/2021   LDLCALC 129 (H) 03/11/2021   TRIG 54.0 03/11/2021   CHOLHDL 4 03/11/2021   Last  hemoglobin A1c Lab Results  Component Value Date   HGBA1C 6.1 10/17/2019   Last thyroid functions Lab Results  Component Value Date   TSH 1.83 10/17/2019   IMPRESSION AND PLAN:  #1 tobacco dependence. We will help him with cessation attempt with Wellbutrin 150 mg XL daily.  He will start this now. Discussed nicotine replacement to go with this--- a patch daily starting with 21 mg, decreasing dose every month x3.  He will consider this. He will call in 1 month to report how he is doing and we will decide upon dose change at that time. I did let him know that this medication is meant to be taken for 32-month duration for this indication.  2.  Mild hypercholesterolemia. LDL was 129--> he is low cardiovascular risk. TLC recommended.  Repeat lipids 1 year.  An After Visit Summary was printed and given to the patient.  FOLLOW UP: Return in about 1 year (around 03/20/2022) for annual CPE (fasting).  Signed:  Santiago Bumpers, MD           03/20/2021

## 2021-03-27 ENCOUNTER — Other Ambulatory Visit: Payer: Self-pay | Admitting: Family Medicine

## 2021-09-18 ENCOUNTER — Other Ambulatory Visit: Payer: Self-pay | Admitting: Family Medicine

## 2022-03-21 ENCOUNTER — Other Ambulatory Visit: Payer: Self-pay | Admitting: Family Medicine

## 2022-04-13 ENCOUNTER — Other Ambulatory Visit: Payer: Self-pay | Admitting: Family Medicine

## 2022-04-17 ENCOUNTER — Other Ambulatory Visit: Payer: Self-pay | Admitting: Family Medicine

## 2022-09-10 ENCOUNTER — Encounter: Payer: Self-pay | Admitting: Family Medicine

## 2022-09-10 ENCOUNTER — Ambulatory Visit (INDEPENDENT_AMBULATORY_CARE_PROVIDER_SITE_OTHER): Payer: Self-pay | Admitting: Family Medicine

## 2022-09-10 VITALS — BP 120/80 | HR 58 | Ht 72.0 in | Wt 195.0 lb

## 2022-09-10 DIAGNOSIS — R7303 Prediabetes: Secondary | ICD-10-CM

## 2022-09-10 DIAGNOSIS — Z125 Encounter for screening for malignant neoplasm of prostate: Secondary | ICD-10-CM

## 2022-09-10 DIAGNOSIS — I2699 Other pulmonary embolism without acute cor pulmonale: Secondary | ICD-10-CM

## 2022-09-10 DIAGNOSIS — Z7901 Long term (current) use of anticoagulants: Secondary | ICD-10-CM

## 2022-09-10 DIAGNOSIS — Z0000 Encounter for general adult medical examination without abnormal findings: Secondary | ICD-10-CM

## 2022-09-10 DIAGNOSIS — Z8042 Family history of malignant neoplasm of prostate: Secondary | ICD-10-CM

## 2022-09-10 DIAGNOSIS — Z1211 Encounter for screening for malignant neoplasm of colon: Secondary | ICD-10-CM

## 2022-09-10 LAB — LIPID PANEL
Cholesterol: 210 mg/dL — ABNORMAL HIGH (ref 0–200)
HDL: 52 mg/dL (ref >39.00)
LDL Cholesterol: 133 mg/dL — ABNORMAL HIGH (ref 0–99)
NonHDL: 158.2
Total CHOL/HDL Ratio: 4
Triglycerides: 126 mg/dL (ref 0.0–149.0)
VLDL: 25.2 mg/dL (ref 0.0–40.0)

## 2022-09-10 LAB — CBC WITH DIFFERENTIAL/PLATELET
Basophils Absolute: 0.1 10*3/uL (ref 0.0–0.1)
Basophils Relative: 1 % (ref 0.0–3.0)
Eosinophils Absolute: 0.1 10*3/uL (ref 0.0–0.7)
Eosinophils Relative: 1.4 % (ref 0.0–5.0)
HCT: 46.5 % (ref 39.0–52.0)
Hemoglobin: 15.3 g/dL (ref 13.0–17.0)
Lymphocytes Relative: 29.7 % (ref 12.0–46.0)
Lymphs Abs: 2.2 10*3/uL (ref 0.7–4.0)
MCHC: 32.9 g/dL (ref 30.0–36.0)
MCV: 88.3 fl (ref 78.0–100.0)
Monocytes Absolute: 0.4 10*3/uL (ref 0.1–1.0)
Monocytes Relative: 5.6 % (ref 3.0–12.0)
Neutro Abs: 4.6 10*3/uL (ref 1.4–7.7)
Neutrophils Relative %: 62.3 % (ref 43.0–77.0)
Platelets: 222 10*3/uL (ref 150.0–400.0)
RBC: 5.27 Mil/uL (ref 4.22–5.81)
RDW: 13.8 % (ref 11.5–15.5)
WBC: 7.3 10*3/uL (ref 4.0–10.5)

## 2022-09-10 LAB — HEMOGLOBIN A1C: Hgb A1c MFr Bld: 6 % (ref 4.6–6.5)

## 2022-09-10 LAB — COMPREHENSIVE METABOLIC PANEL
ALT: 33 U/L (ref 0–53)
AST: 25 U/L (ref 0–37)
Albumin: 4.5 g/dL (ref 3.5–5.2)
Alkaline Phosphatase: 57 U/L (ref 39–117)
BUN: 14 mg/dL (ref 6–23)
CO2: 29 mEq/L (ref 19–32)
Calcium: 9.6 mg/dL (ref 8.4–10.5)
Chloride: 100 mEq/L (ref 96–112)
Creatinine, Ser: 1.29 mg/dL (ref 0.40–1.50)
GFR: 66.65 mL/min (ref >60.00)
Glucose, Bld: 88 mg/dL (ref 70–99)
Potassium: 4.2 mEq/L (ref 3.5–5.1)
Sodium: 139 mEq/L (ref 135–145)
Total Bilirubin: 0.7 mg/dL (ref 0.2–1.2)
Total Protein: 6.7 g/dL (ref 6.0–8.3)

## 2022-09-10 LAB — PSA: PSA: 0.55 ng/mL (ref 0.10–4.00)

## 2022-09-10 MED ORDER — RIVAROXABAN 10 MG PO TABS
10.0000 mg | ORAL_TABLET | Freq: Every day | ORAL | 3 refills | Status: DC
Start: 2022-09-10 — End: 2024-01-05

## 2022-09-10 NOTE — Progress Notes (Signed)
Office Note 09/10/2022  CC:  Chief Complaint  Patient presents with   Annual Exam   HPI:  Patient is a 47 y.o. male who is here for annual health maintenance exam and f/u chronic anticoagulation for hx of recurrent PE.  He has been compliant with his Xarelto. He feels well. He is active and eats healthy.  Past Medical History:  Diagnosis Date   Prediabetes 07/2017   Fasting gluc 109; A1c 6.0%.   Recurrent pulmonary embolism (HCC) 2011 and 2018   Negative hypercoagulable w/u 2011.  06/24/16 D-dimer norjmal suggesting risk for clot next 1 year is low, but given FH and recurrence--Xarelto indefinitely per pulm (most recent f/u visit 06/2016.)  D dimer neg 01/2018-->pt may decrease to xarelto 10mg  qd dose and needs to be on this indefinitely.   Tobacco abuse     History reviewed. No pertinent surgical history.  Family History  Problem Relation Age of Onset   Hypertension Mother    Diabetes Mother    Hyperlipidemia Mother    Miscarriages / [place] Mother    Angina Father     Social History   Socioeconomic History   Marital status: Married    Spouse name: Not on file   Number of children: Not on file   Years of education: Not on file   Highest education level: GED or equivalent  Occupational History   Occupation: Psychologist, sport and exercise    Comment: retail store  Tobacco Use   Smoking status: Every Day    Current packs/day: 1.00    Average packs/day: 1 pack/day for 17.0 years (17.0 ttl pk-yrs)    Types: Cigarettes   Smokeless tobacco: Never   Tobacco comments:    1/2 ppd 06/23/2016 ee  Vaping Use   Vaping status: Never Used  Substance and Sexual Activity   Alcohol use: No    Alcohol/week: 0.0 standard drinks of alcohol   Drug use: No   Sexual activity: Not on file  Other Topics Concern   Not on file  Social History Narrative   Married, 1 daughter and 2 sons.   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   Tobacco: 20 pack yr hx-->current as of 07/2017.   Alcohol:  rare.   Orig from [place], moved to [place] around 1998.   [place] since that time.   Social Determinants of Health   Financial Resource Strain: Low Risk  (03/16/2021)   Overall Financial Resource Strain (CARDIA)    Difficulty of Paying Living Expenses: Not hard at all  Food Insecurity: No Food Insecurity (03/16/2021)   Hunger Vital Sign    Worried About Running Out of Food in the Last Year: Never true    Ran Out of Food in the Last Year: Never true  Transportation Needs: No Transportation Needs (03/16/2021)   PRAPARE - Administrator, Civil Service (Medical): No    Lack of Transportation (Non-Medical): No  Physical Activity: Insufficiently Active (03/16/2021)   Exercise Vital Sign    Days of Exercise per Week: 3 days    Minutes of Exercise per Session: 30 min  Stress: No Stress Concern Present (03/16/2021)   [employer] of Occupational Health - Occupational Stress Questionnaire    Feeling of Stress : Not at all  Social Connections: Moderately Integrated (03/16/2021)   Social Connection and Isolation Panel [NHANES]    Frequency of Communication with Friends and Family: Twice a week    Frequency of Social Gatherings with Friends and Family: More than three  times a week    Attends Religious Services: 1 to 4 times per year    Active Member of Clubs or Organizations: No    Attends Engineer, structural: Not on file    Marital Status: Married  Catering manager Violence: Not on file    Outpatient Medications Prior to Visit  Medication Sig Dispense Refill   rivaroxaban (XARELTO) 10 MG TABS tablet Take 1 tablet (10 mg total) by mouth daily. OFFICE VISIT NEEDED FOR FURTHER REFILLS 90 tablet 3   buPROPion (WELLBUTRIN XL) 150 MG 24 hr tablet TAKE 1 TABLET BY MOUTH EVERY DAY (Patient not taking: Reported on 09/10/2022) 30 tablet 0   No facility-administered medications prior to visit.    No Known Allergies  Review of Systems  Constitutional:  Negative for appetite change,  chills, fatigue and fever.  HENT:  Negative for congestion, dental problem, ear pain and sore throat.   Eyes:  Negative for discharge, redness and visual disturbance.  Respiratory:  Negative for cough, chest tightness, shortness of breath and wheezing.   Cardiovascular:  Negative for chest pain, palpitations and leg swelling.  Gastrointestinal:  Negative for abdominal pain, blood in stool, diarrhea, nausea and vomiting.  Genitourinary:  Negative for difficulty urinating, dysuria, flank pain, frequency, hematuria and urgency.  Musculoskeletal:  Negative for arthralgias, back pain, joint swelling, myalgias and neck stiffness.  Skin:  Negative for pallor and rash.  Neurological:  Negative for dizziness, speech difficulty, weakness and headaches.  Hematological:  Negative for adenopathy. Does not bruise/bleed easily.  Psychiatric/Behavioral:  Negative for confusion and sleep disturbance. The patient is not nervous/anxious.    PE;    09/10/2022    1:32 PM 03/20/2021    8:22 AM 02/20/2021   11:00 AM  Vitals with BMI  Height 6\' 0"  6\' 0"  6\' 0"   Weight 195 lbs 199 lbs 3 oz 199 lbs 10 oz  BMI 26.44 27.01 27.06  Systolic 120 99 104  Diastolic 80 62 66  Pulse 58 65 65    Gen: Alert, well appearing.  Patient is oriented to person, place, time, and situation. AFFECT: pleasant, lucid thought and speech. ENT: Ears: EACs clear, normal epithelium.  TMs with good light reflex and landmarks bilaterally.  Eyes: no injection, icteris, swelling, or exudate.  EOMI, PERRLA. Nose: no drainage or turbinate edema/swelling.  No injection or focal lesion.  Mouth: lips without lesion/swelling.  Oral mucosa pink and moist.  Dentition intact and without obvious caries or gingival swelling.  Oropharynx without erythema, exudate, or swelling.  Neck: supple/nontender.  No LAD, mass, or TM.  Carotid pulses 2+ bilaterally, without bruits. CV: RRR, no m/r/g.   LUNGS: CTA bilat, nonlabored resps, good aeration in all lung  fields. ABD: soft, NT, ND, BS normal.  No hepatospenomegaly or mass.  No bruits. EXT: no clubbing, cyanosis, or edema.  Musculoskeletal: no joint swelling, erythema, warmth, or tenderness.  ROM of all joints intact. Skin - no sores or suspicious lesions or rashes or color changes  Pertinent labs:  Lab Results  Component Value Date   TSH 1.83 10/17/2019   Lab Results  Component Value Date   WBC 5.4 03/11/2021   HGB 14.4 03/11/2021   HCT 43.0 03/11/2021   MCV 86.5 03/11/2021   PLT 209.0 03/11/2021   Lab Results  Component Value Date   CREATININE 1.19 03/11/2021   BUN 25 (H) 03/11/2021   NA 138 03/11/2021   K 4.3 03/11/2021   CL 103 03/11/2021  CO2 28 03/11/2021   Lab Results  Component Value Date   ALT 24 03/11/2021   AST 21 03/11/2021   ALKPHOS 63 03/11/2021   BILITOT 0.5 03/11/2021   Lab Results  Component Value Date   CHOL 183 03/11/2021   Lab Results  Component Value Date   HDL 43.90 03/11/2021   Lab Results  Component Value Date   LDLCALC 129 (H) 03/11/2021   Lab Results  Component Value Date   TRIG 54.0 03/11/2021   Lab Results  Component Value Date   CHOLHDL 4 03/11/2021   Lab Results  Component Value Date   PSA 0.44 10/17/2019   Lab Results  Component Value Date   HGBA1C 6.1 10/17/2019   ASSESSMENT AND PLAN:   #1 health maintenance exam: Reviewed age and gender appropriate health maintenance issues (prudent diet, regular exercise, health risks of tobacco and excessive alcohol, use of seatbelts, fire alarms in home, use of sunscreen).  Also reviewed age and gender appropriate health screening as well as vaccine recommendations. Vaccines: All up-to-date.   Labs: CBC, c-Met, lipid panel, PSA, hemoglobin A1c (pt last ate 1 hour ago). Prostate ca screening: He has a family history of prostate cancer in his father.  PSA ordered. Colon ca screening: average risk patient= as per latest guidelines he is due to get initial screening.  Options  discussed--> referred to GI for colonoscopy today.  #2 recurrent pulmonary emboli : He is on Xarelto indefinitely. Refilled Xarelto today. CBC monitoring today.  3.  Prediabetes. He has healthy diet and exercise habits. Nonfasting glucose and hemoglobin A1c today.  FOLLOW UP:  Return in about 1 year (around 09/10/2023) for annual CPE (fasting).  Signed:  Santiago Bumpers, MD           09/10/2022

## 2022-09-10 NOTE — Patient Instructions (Signed)

## 2022-09-11 ENCOUNTER — Encounter: Payer: Self-pay | Admitting: Family Medicine

## 2023-04-15 ENCOUNTER — Ambulatory Visit: Payer: BC Managed Care – PPO | Admitting: Gastroenterology

## 2023-05-26 ENCOUNTER — Encounter: Payer: Self-pay | Admitting: Gastroenterology

## 2023-05-26 ENCOUNTER — Ambulatory Visit: Payer: BC Managed Care – PPO | Admitting: Gastroenterology

## 2023-05-26 ENCOUNTER — Telehealth: Payer: Self-pay

## 2023-05-26 VITALS — BP 104/70 | HR 64 | Ht 73.0 in | Wt 202.2 lb

## 2023-05-26 DIAGNOSIS — Z1211 Encounter for screening for malignant neoplasm of colon: Secondary | ICD-10-CM | POA: Diagnosis not present

## 2023-05-26 DIAGNOSIS — Z7901 Long term (current) use of anticoagulants: Secondary | ICD-10-CM

## 2023-05-26 MED ORDER — SUFLAVE 178.7 G PO SOLR: 1.0000 | Freq: Once | ORAL | 0 refills | Status: AC

## 2023-05-26 NOTE — Progress Notes (Signed)
 HPI :  48 year old male with a history of recurrent pulmonary embolism on chronic Xarelto, tobacco use, here to establish his GI care to discuss screening colonoscopy.  He has never had a colonoscopy before.  No family history of colon cancer.  He denies any problems with his bowels that bother him.  No blood in his stools.  No abdominal pains.  He generally feels well without any complaints today.  He recalls having a DVT and then PE around 20 18-20 19 timeframe.  He has been on Xarelto/anticoagulation since then and appears to tolerate it well.  He denies any prior operations but has had anesthesia for some dental work in the past and states he tolerated it well.  He denies any cardiopulmonary symptoms.  He is a current tobacco user, has smoked cigarettes for many years.  He has had labs from this past July 2024 showing no anemia.    Past Medical History:  Diagnosis Date   Hypercholesterolemia    mild   Prediabetes 07/2017   Fasting gluc 109; A1c 6.0%.   Recurrent pulmonary embolism (HCC) 2011 and 2018   Negative hypercoagulable w/u 2011.  06/24/16 D-dimer norjmal suggesting risk for clot next 1 year is low, but given FH and recurrence--Xarelto indefinitely per pulm (most recent f/u visit 06/2016.)  D dimer neg 01/2018-->pt may decrease to xarelto 10mg  qd dose and needs to be on this indefinitely.   Tobacco abuse      History reviewed. No pertinent surgical history. Family History  Problem Relation Age of Onset   Hypertension Mother    Diabetes Mother    Hyperlipidemia Mother    Miscarriages / [place] Mother    Angina Father    Social History   Tobacco Use   Smoking status: Every Day    Current packs/day: 1.00    Average packs/day: 1 pack/day for 17.0 years (17.0 ttl pk-yrs)    Types: Cigarettes   Smokeless tobacco: Never   Tobacco comments:    1/2 ppd 06/23/2016 ee  Vaping Use   Vaping status: Never Used  Substance Use Topics   Alcohol use: No    Alcohol/week: 0.0  standard drinks of alcohol   Drug use: No   Current Outpatient Medications  Medication Sig Dispense Refill   rivaroxaban (XARELTO) 10 MG TABS tablet Take 1 tablet (10 mg total) by mouth daily. 90 tablet 3   No current facility-administered medications for this visit.   No Known Allergies   Review of Systems: All systems reviewed and negative except where noted in HPI.   Lab Results  Component Value Date   WBC 7.3 09/10/2022   HGB 15.3 09/10/2022   HCT 46.5 09/10/2022   MCV 88.3 09/10/2022   PLT 222.0 09/10/2022    Lab Results  Component Value Date   NA 139 09/10/2022   CL 100 09/10/2022   K 4.2 09/10/2022   CO2 29 09/10/2022   BUN 14 09/10/2022   CREATININE 1.29 09/10/2022   GFR 66.65 09/10/2022   CALCIUM 9.6 09/10/2022   ALBUMIN 4.5 09/10/2022   GLUCOSE 88 09/10/2022    Lab Results  Component Value Date   ALT 33 09/10/2022   AST 25 09/10/2022   ALKPHOS 57 09/10/2022   BILITOT 0.7 09/10/2022     Physical Exam: BP 104/70   Pulse 64   Ht 6\' 1"  (1.854 m)   Wt 202 lb 4 oz (91.7 kg)   BMI 26.68 kg/m  Constitutional: Pleasant,well-developed, male in no acute  distress. HEENT: Normocephalic and atraumatic. Conjunctivae are normal. No scleral icterus. Neck supple.  Cardiovascular: Normal rate, regular rhythm.  Pulmonary/chest: Effort normal and breath sounds normal.  Abdominal: Soft, nondistended, nontender.  There are no masses palpable.  Extremities: no edema Neurological: Alert and oriented to person place and time. Skin: Skin is warm and dry. No rashes noted. Psychiatric: Normal mood and affect. Behavior is normal.   ASSESSMENT: 48 y.o. male here for assessment of the following  1. Colon cancer screening   2. Anticoagulated    Here for first-time colon cancer screening.  He is asymptomatic, no alarm symptoms, no family history of colon cancer.  Average risk for colon cancer.  Discussed options regarding colon cancer screening with him, the importance  and reasoning of why screening is recommended, recommend optical colonoscopy if he is able to do it.  Discussed what colonoscopy is, risks and benefits of the procedure and anesthesia, and he wanted to proceed.  Given his history of chronic anticoagulation, requesting approval from his prescribing provider to hold Xarelto for 2 days prior to the exam to reduce risk for bleeding.  He understands and is agreeable to this if approved.  Further recommendations pending the results of the exam.  Referred to the scheduler for coordinating the exam / bowel prep.   Harlin Rain, MD [facility]  CC: McGowen, Maryjean Morn, MD

## 2023-05-26 NOTE — Telephone Encounter (Signed)
  Marvin Escobar [birth year] [id number]  05/26/23   Dear Dr. Marvel Plan:  We have scheduled the above named patient for a(n) colonoscopy procedure. Our records show that (s)he is on anticoagulation therapy.  Please advise as to whether the patient may come off their therapy of XARELTO 2 days prior to their procedure which is scheduled for 07-22-23.  Please route your response to Berlinda Last, CMA or fax response to [phone].  Sincerely,    [facility]

## 2023-05-26 NOTE — Patient Instructions (Signed)
 You have been scheduled for a Colonoscopy. Please follow written instructions given to you at your visit today.  If you use inhalers (even only as needed), please bring them with you on the day of your procedure. If you need to reschedule or cancel this appointment please call at least a week in advance.  DO NOT TAKE 7 DAYS PRIOR TO TEST-  Trulicity (dulaglutide) Ozempic, Wegovy (semaglutide) Mounjaro (tirzepatide) Bydureon Bcise (exanatide extended release)  DO NOT TAKE 1 DAY PRIOR TO YOUR TEST  Rybelsus (semaglutide) Adlyxin (lixisenatide) Victoza (liraglutide) Byetta (exanatide) ___________________________________________________________________________   Your Provider Has Sent Your Colonoscopy Prep To [facility], which ensures the lowest copay and free home delivery.  They will contact you (via text or call) to confirm you authorize them to process the prescription from your doctor. To set up delivery, you must complete the checkout process via link in a text message or speak to one of the patient care representatives. If [facility] is unable to reach you, your prescription will be delayed.  Please be watching for a call or text from this number: [phone] and remember, you must confirm the request to get your prep.  [facility] accepts all major insurance benefits and applies discounts & coupons. They will take your copay over the phone and mail you your colonoscopy prep.   Have additional questions?   Chat: [email or web address] (the fastest and easiest way to communicate with them) Call: [phone] Email: [email or web address] NCPDP: [id number]  Texts you receive from [phone] Are NOT Spam.  If you have not heard from them within 2 days after your prescription was ordered, please reach out to them to initiate the order of your prep.    Thank you for entrusting me with your care and for choosing [facility], Dr. Ileene Patrick    If  your blood pressure at your visit was 140/90 or greater, please contact your primary care physician to follow up on this. ______________________________________________________  If you are age 48 or older, your body mass index should be between 23-30. Your Body mass index is 26.68 kg/m. If this is out of the aforementioned range listed, please consider follow up with your Primary Care Provider.  If you are age 68 or younger, your body mass index should be between 19-25. Your Body mass index is 26.68 kg/m. If this is out of the aformentioned range listed, please consider follow up with your Primary Care Provider.  ________________________________________________________  The [facility] providers would like to encourage you to use [employer] to communicate with providers for non-urgent requests or questions.  Due to long hold times on the telephone, sending your provider a message by [facility] may be a faster and more efficient way to get a response.  Please allow 48 business hours for a response.  Please remember that this is for non-urgent requests.  _______________________________________________________  Due to recent changes in healthcare laws, you may see the results of your imaging and laboratory studies on MyChart before your provider has had a chance to review them.  We understand that in some cases there may be results that are confusing or concerning to you. Not all laboratory results come back in the same time frame and the provider may be waiting for multiple results in order to interpret others.  Please give [place] 48 hours in order for your provider to thoroughly review all the results before contacting the office for clarification of your results.

## 2023-05-27 NOTE — Telephone Encounter (Signed)
 Called and LM for patient to hold Xarelto on 6-2 and 6-3.  Asked him to call back to confirm understanding

## 2023-05-27 NOTE — Telephone Encounter (Signed)
 Patient returned phone call and confirmed he received message regarding holding blood thinner.

## 2023-06-24 ENCOUNTER — Encounter: Admitting: Gastroenterology

## 2023-07-08 ENCOUNTER — Encounter: Payer: Self-pay | Admitting: Gastroenterology

## 2023-07-22 ENCOUNTER — Encounter: Payer: Self-pay | Admitting: Gastroenterology

## 2023-07-22 ENCOUNTER — Ambulatory Visit: Admitting: Gastroenterology

## 2023-07-22 VITALS — BP 127/67 | HR 51 | Temp 98.4°F | Resp 16 | Ht 73.0 in | Wt 202.0 lb

## 2023-07-22 DIAGNOSIS — D128 Benign neoplasm of rectum: Secondary | ICD-10-CM

## 2023-07-22 DIAGNOSIS — K6289 Other specified diseases of anus and rectum: Secondary | ICD-10-CM | POA: Diagnosis not present

## 2023-07-22 DIAGNOSIS — Z1211 Encounter for screening for malignant neoplasm of colon: Secondary | ICD-10-CM | POA: Diagnosis present

## 2023-07-22 DIAGNOSIS — K648 Other hemorrhoids: Secondary | ICD-10-CM

## 2023-07-22 DIAGNOSIS — K635 Polyp of colon: Secondary | ICD-10-CM | POA: Diagnosis not present

## 2023-07-22 DIAGNOSIS — D123 Benign neoplasm of transverse colon: Secondary | ICD-10-CM

## 2023-07-22 MED ORDER — SODIUM CHLORIDE 0.9 % IV SOLN: 500.0000 mL | Freq: Once | INTRAVENOUS | Status: DC

## 2023-07-22 NOTE — Op Note (Signed)
 [facility] Patient Name: Marvin Escobar Procedure Date: 07/22/2023 8:34 AM MRN: [id number] Endoscopist: Landon Pinion P. General Kenner , MD, [id number] Age: 48 Referring MD:  Date of Birth: [birth year] Gender: Male Account #: [id number] Procedure:                Colonoscopy Indications:              Screening for colorectal malignant neoplasm, This                            is the patient's first colonoscopy Medicines:                Monitored Anesthesia Care Procedure:                Pre-Anesthesia Assessment:                           - Prior to the procedure, a History and Physical                            was performed, and patient medications and                            allergies were reviewed. The patient's tolerance of                            previous anesthesia was also reviewed. The risks                            and benefits of the procedure and the sedation                            options and risks were discussed with the patient.                            All questions were answered, and informed consent                            was obtained. Prior Anticoagulants: The patient has                            taken Xarelto  (rivaroxaban ), last dose was 2 days                            prior to procedure. ASA Grade Assessment: II - A                            patient with mild systemic disease. After reviewing                            the risks and benefits, the patient was deemed in                            satisfactory condition to undergo the procedure.  After obtaining informed consent, the colonoscope                            was passed under direct vision. Throughout the                            procedure, the patient's blood pressure, pulse, and                            oxygen saturations were monitored continuously. The                            Olympus Scope SN [id number] was introduced through the                             anus and advanced to the the cecum, identified by                            appendiceal orifice and ileocecal valve. The                            colonoscopy was performed without difficulty. The                            patient tolerated the procedure well. The quality                            of the bowel preparation was good. The ileocecal                            valve, appendiceal orifice, and rectum were                            photographed. Scope In: 8:39:51 AM Scope Out: 8:57:51 AM Scope Withdrawal Time: 0 hours 14 minutes 56 seconds  Total Procedure Duration: 0 hours 18 minutes 0 seconds  Findings:                 The perianal and digital rectal examinations were                            normal.                           A diminutive polyp was found in the hepatic                            flexure. The polyp was flat. The polyp was removed                            with a cold snare. Resection and retrieval were                            complete.  Three sessile polyps were found in the proximal                            rectum. The polyps were 4 to 8 mm in size. These                            polyps were removed with a cold snare. Resection                            and retrieval were complete.                           Anal papilla(e) were hypertrophied.                           Internal hemorrhoids were found during retroflexion.                           The exam was otherwise without abnormality. Complications:            No immediate complications. Estimated blood loss:                            Minimal. Estimated Blood Loss:     Estimated blood loss was minimal. Impression:               - One diminutive polyp at the hepatic flexure,                            removed with a cold snare. Resected and retrieved.                           - Three 4 to 8 mm polyps in the proximal rectum,                            removed  with a cold snare. Resected and retrieved.                           - Anal papilla(e) were hypertrophied.                           - Internal hemorrhoids.                           - The examination was otherwise normal. Recommendation:           - Patient has a contact number available for                            emergencies. The signs and symptoms of potential                            delayed complications were discussed with the  patient. Return to normal activities tomorrow.                            Written discharge instructions were provided to the                            patient.                           - Resume previous diet.                           - Continue present medications.                           - Resume Xarelto  tomorrow.                           - Await pathology results.                           - No ibuprofen, naproxen, or other non-steroidal                            anti-inflammatory drugs for 2 weeks after polyp                            removal. Landon Pinion P. Eyvette Cordon, MD 07/22/2023 9:02:52 AM This report has been signed electronically.

## 2023-07-22 NOTE — Progress Notes (Signed)
 Called to room to assist during endoscopic procedure.  Patient ID and intended procedure confirmed with present staff. Received instructions for my participation in the procedure from the performing physician.

## 2023-07-22 NOTE — Patient Instructions (Addendum)
 Handouts Provided:  Polyps  RESUME Xarelto  tomorrow evening at prior dose.  NO ibuprofen, naproxen, or other non-steroidal anti-inflammatory drugs for 2 weeks after polyp removal.  YOU HAD AN ENDOSCOPIC PROCEDURE TODAY AT THE [facility]:   Refer to the procedure report that was given to you for any specific questions about what was found during the examination.  If the procedure report does not answer your questions, please call your gastroenterologist to clarify.  If you requested that your care partner not be given the details of your procedure findings, then the procedure report has been included in a sealed envelope for you to review at your convenience later.  YOU SHOULD EXPECT: Some feelings of bloating in the abdomen. Passage of more gas than usual.  Walking can help get rid of the air that was put into your GI tract during the procedure and reduce the bloating. If you had a lower endoscopy (such as a colonoscopy or flexible sigmoidoscopy) you may notice spotting of blood in your stool or on the toilet paper. If you underwent a bowel prep for your procedure, you may not have a normal bowel movement for a few days.  Please Note:  You might notice some irritation and congestion in your nose or some drainage.  This is from the oxygen used during your procedure.  There is no need for concern and it should clear up in a day or so.  SYMPTOMS TO REPORT IMMEDIATELY:  Following lower endoscopy (colonoscopy or flexible sigmoidoscopy):  Excessive amounts of blood in the stool  Significant tenderness or worsening of abdominal pains  Swelling of the abdomen that is new, acute  Fever of 100F or higher  For urgent or emergent issues, a gastroenterologist can be reached at any hour by calling [phone]. Do not use MyChart messaging for urgent concerns.    DIET:  We do recommend a small meal at first, but then you may proceed to your regular diet.  Drink plenty of fluids but you  should avoid alcoholic beverages for 24 hours.  ACTIVITY:  You should plan to take it easy for the rest of today and you should NOT DRIVE or use heavy machinery until tomorrow (because of the sedation medicines used during the test).    FOLLOW UP: Our staff will call the number listed on your records the next business day following your procedure.  We will call around 7:15- 8:00 am to check on you and address any questions or concerns that you may have regarding the information given to you following your procedure. If we do not reach you, we will leave a message.     If any biopsies were taken you will be contacted by phone or by letter within the next 1-3 weeks.  Please call us  at [phone] if you have not heard about the biopsies in 3 weeks.    SIGNATURES/CONFIDENTIALITY: You and/or your care partner have signed paperwork which will be entered into your electronic medical record.  These signatures attest to the fact that that the information above on your After Visit Summary has been reviewed and is understood.  Full responsibility of the confidentiality of this discharge information lies with you and/or your care-partner.

## 2023-07-22 NOTE — Progress Notes (Signed)
 Pt's states no medical or surgical changes since previsit or office visit.

## 2023-07-22 NOTE — Progress Notes (Signed)
 [facility] History and Physical   Primary Care Physician:  Shelvia Dick, MD   Reason for Procedure:   Colon cancer screening  Plan:    colonoscopy     HPI: Marvin Escobar is a 48 y.o. male  here for colonoscopy screening - first time exam.   Patient denies any bowel symptoms at this time. No family history of colon cancer known. Otherwise feels well without any cardiopulmonary symptoms. Xarelto  has been held for 2 days prior to this exam.   I have discussed risks / benefits of anesthesia and endoscopic procedure with Marvin Escobar and they wish to proceed with the exams as outlined today.    Past Medical History:  Diagnosis Date   Hypercholesterolemia    mild   Prediabetes 07/2017   Fasting gluc 109; A1c 6.0%.   Recurrent pulmonary embolism (HCC) 2011 and 2018   Negative hypercoagulable w/u 2011.  06/24/16 D-dimer norjmal suggesting risk for clot next 1 year is low, but given FH and recurrence--Xarelto  indefinitely per pulm (most recent f/u visit 06/2016.)  D dimer neg 01/2018-->pt may decrease to xarelto  10mg  qd dose and needs to be on this indefinitely.   Tobacco abuse     History reviewed. No pertinent surgical history.  Prior to Admission medications   Medication Sig Start Date End Date Taking? Authorizing Provider  rivaroxaban  (XARELTO ) 10 MG TABS tablet Take 1 tablet (10 mg total) by mouth daily. 09/10/22   McGowenMinetta Aly, MD    Current Outpatient Medications  Medication Sig Dispense Refill   rivaroxaban  (XARELTO ) 10 MG TABS tablet Take 1 tablet (10 mg total) by mouth daily. 90 tablet 3   Current Facility-Administered Medications  Medication Dose Route Frequency Provider Last Rate Last Admin   0.9 %  sodium chloride infusion  500 mL Intravenous Once Mercie Balsley, Lendon Queen, MD        Allergies as of 07/22/2023   (No Known Allergies)    Family History  Problem Relation Age of Onset   Hypertension Mother    Diabetes Mother    Hyperlipidemia  Mother    Miscarriages / [place] Mother    Angina Father     Social History   Socioeconomic History   Marital status: Married    Spouse name: Not on file   Number of children: 3   Years of education: Not on file   Highest education level: GED or equivalent  Occupational History   Occupation: Psychologist, sport and exercise    Comment: retail store  Tobacco Use   Smoking status: Every Day    Current packs/day: 1.00    Average packs/day: 1 pack/day for 17.0 years (17.0 ttl pk-yrs)    Types: Cigarettes   Smokeless tobacco: Never   Tobacco comments:    1/2 ppd 06/23/2016 ee  Vaping Use   Vaping status: Never Used  Substance and Sexual Activity   Alcohol use: No    Alcohol/week: 0.0 standard drinks of alcohol   Drug use: No   Sexual activity: Not on file  Other Topics Concern   Not on file  Social History Narrative   Married, 1 daughter and 2 sons.   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   Tobacco: 20 pack yr hx-->current as of 07/2017.   Alcohol: rare.   Orig from [place], moved to [place]  around 1998.   [place] since that time.   Social Drivers of Corporate investment banker Strain: Low Risk  (03/16/2021)   Overall Physicist, medical Strain (  CARDIA)    Difficulty of Paying Living Expenses: Not hard at all  Food Insecurity: No Food Insecurity (03/16/2021)   Hunger Vital Sign    Worried About Running Out of Food in the Last Year: Never true    Ran Out of Food in the Last Year: Never true  Transportation Needs: No Transportation Needs (03/16/2021)   PRAPARE - Administrator, Civil Service (Medical): No    Lack of Transportation (Non-Medical): No  Physical Activity: Insufficiently Active (03/16/2021)   Exercise Vital Sign    Days of Exercise per Week: 3 days    Minutes of Exercise per Session: 30 min  Stress: No Stress Concern Present (03/16/2021)   [employer] of Occupational Health - Occupational Stress Questionnaire    Feeling of Stress : Not at all  Social Connections:  Moderately Integrated (03/16/2021)   Social Connection and Isolation Panel [NHANES]    Frequency of Communication with Friends and Family: Twice a week    Frequency of Social Gatherings with Friends and Family: More than three times a week    Attends Religious Services: 1 to 4 times per year    Active Member of [employer] or Organizations: No    Attends Engineer, structural: Not on file    Marital Status: Married  Catering manager Violence: Not on file    Review of Systems: All other review of systems negative except as mentioned in the HPI.  Physical Exam: Vital signs BP 132/78   Pulse 85   Temp 98.4 F (36.9 C)   Ht 6\' 1"  (1.854 m)   Wt 202 lb (91.6 kg)   SpO2 95%   BMI 26.65 kg/m   General:   Alert,  Well-developed, pleasant and cooperative in NAD Lungs:  Clear throughout to auscultation.   Heart:  Regular rate and rhythm Abdomen:  Soft, nontender and nondistended.   Neuro/Psych:  Alert and cooperative. Normal mood and affect. A and O x 3  Christi Coward, MD [facility]

## 2023-07-22 NOTE — Progress Notes (Signed)
 Report given to PACU, vss

## 2023-07-23 ENCOUNTER — Telehealth: Payer: Self-pay

## 2023-07-23 NOTE — Telephone Encounter (Signed)
  Follow up Call-     07/22/2023    7:58 AM  Call back number  Post procedure Call Back phone  # [phone]  Permission to leave phone message Yes     Patient questions:  Do you have a fever, pain , or abdominal swelling? No. Pain Score  0 *  Have you tolerated food without any problems? Yes.    Have you been able to return to your normal activities? Yes.    Do you have any questions about your discharge instructions: Diet   No. Medications  No. Follow up visit  No.  Do you have questions or concerns about your Care? No.  Actions: * If pain score is 4 or above: No action needed, pain <4.

## 2023-07-24 LAB — SURGICAL PATHOLOGY

## 2023-07-25 ENCOUNTER — Ambulatory Visit: Payer: Self-pay | Admitting: Gastroenterology

## 2023-09-11 ENCOUNTER — Encounter: Payer: Self-pay | Admitting: Family Medicine

## 2023-09-11 DIAGNOSIS — Z7901 Long term (current) use of anticoagulants: Secondary | ICD-10-CM

## 2023-09-24 ENCOUNTER — Other Ambulatory Visit: Payer: Self-pay | Admitting: Family Medicine

## 2023-09-24 DIAGNOSIS — Z7901 Long term (current) use of anticoagulants: Secondary | ICD-10-CM

## 2024-01-06 ENCOUNTER — Ambulatory Visit: Admitting: Family Medicine

## 2024-01-06 VITALS — BP 104/56 | HR 73 | Temp 97.2°F | Ht 72.5 in | Wt 204.2 lb

## 2024-01-06 DIAGNOSIS — Z7901 Long term (current) use of anticoagulants: Secondary | ICD-10-CM

## 2024-01-06 DIAGNOSIS — Z125 Encounter for screening for malignant neoplasm of prostate: Secondary | ICD-10-CM

## 2024-01-06 DIAGNOSIS — Z0000 Encounter for general adult medical examination without abnormal findings: Secondary | ICD-10-CM | POA: Diagnosis not present

## 2024-01-06 MED ORDER — RIVAROXABAN 10 MG PO TABS: 10.0000 mg | ORAL_TABLET | Freq: Every day | ORAL | 3 refills | Status: AC

## 2024-01-06 NOTE — Progress Notes (Signed)
 Office Note 01/06/2024  CC:  Chief Complaint  Patient presents with   Annual Exam    Pt is not fasting   HPI:  Patient is a 48 y.o. male who is here for annual health maintenance exam. Dennice is feeling well. He ran out of his Xarelto  a couple of weeks ago.  Still smoking. No leg pain or swelling, no shortness of breath or chest pain.  Past Medical History:  Diagnosis Date   Hypercholesterolemia    mild   Prediabetes 07/2017   Fasting gluc 109; A1c 6.0%.   Recurrent pulmonary embolism (HCC) 2011 and 2018   Negative hypercoagulable w/u 2011.  06/24/16 D-dimer norjmal suggesting risk for clot next 1 year is low, but given FH and recurrence--Xarelto  indefinitely per pulm (most recent f/u visit 06/2016.)  D dimer neg 01/2018-->pt may decrease to xarelto  10mg  qd dose and needs to be on this indefinitely.   Tobacco abuse     Past Surgical History:  Procedure Laterality Date   COLONOSCOPY     07/2023, adenomas-->recall 3 yrs    Family History  Problem Relation Age of Onset   Hypertension Mother    Diabetes Mother    Hyperlipidemia Mother    Miscarriages / Stillbirths Mother    Angina Father     Social History   Socioeconomic History   Marital status: Married    Spouse name: Not on file   Number of children: 3   Years of education: Not on file   Highest education level: Some college, no degree  Occupational History   Occupation: psychologist, sport and exercise    Comment: retail store  Tobacco Use   Smoking status: Every Day    Current packs/day: 1.00    Average packs/day: 1 pack/day for 17.0 years (17.0 ttl pk-yrs)    Types: Cigarettes   Smokeless tobacco: Never   Tobacco comments:    1/2 ppd 06/23/2016 ee  Vaping Use   Vaping status: Never Used  Substance and Sexual Activity   Alcohol use: No    Alcohol/week: 0.0 standard drinks of alcohol   Drug use: No   Sexual activity: Not on file  Other Topics Concern   Not on file  Social History Narrative   Married, 1 daughter  and 2 sons.   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   Tobacco: 20 pack yr hx-->current as of 2025   Alcohol: rare.   Orig from [place], moved to [place]  around 1998.   [place] since that time.   Social Drivers of Corporate Investment Banker Strain: Low Risk  (01/06/2024)   Overall Financial Resource Strain (CARDIA)    Difficulty of Paying Living Expenses: Not hard at all  Food Insecurity: No Food Insecurity (01/06/2024)   Hunger Vital Sign    Worried About Running Out of Food in the Last Year: Never true    Ran Out of Food in the Last Year: Never true  Transportation Needs: No Transportation Needs (01/06/2024)   PRAPARE - Administrator, Civil Service (Medical): No    Lack of Transportation (Non-Medical): No  Physical Activity: Insufficiently Active (01/06/2024)   Exercise Vital Sign    Days of Exercise per Week: 1 day    Minutes of Exercise per Session: 20 min  Stress: No Stress Concern Present (01/06/2024)   [employer] of Occupational Health - Occupational Stress Questionnaire    Feeling of Stress: Not at all  Social Connections: Moderately Isolated (01/06/2024)   Social Connection and  Isolation Panel    Frequency of Communication with Friends and Family: Once a week    Frequency of Social Gatherings with Friends and Family: Once a week    Attends Religious Services: More than 4 times per year    Active Member of [employer] or Organizations: No    Attends Engineer, Structural: Not on file    Marital Status: Married  Catering Manager Violence: Not on file    Outpatient Medications Prior to Visit  Medication Sig Dispense Refill   rivaroxaban  (XARELTO ) 10 MG TABS tablet Take 1 tablet (10 mg total) by mouth daily. 90 tablet 3   No facility-administered medications prior to visit.    No Known Allergies  Review of Systems  Constitutional:  Negative for appetite change, chills, fatigue and fever.  HENT:  Negative for congestion, dental problem, ear pain and  sore throat.   Eyes:  Negative for discharge, redness and visual disturbance.  Respiratory:  Negative for cough, chest tightness, shortness of breath and wheezing.   Cardiovascular:  Negative for chest pain, palpitations and leg swelling.  Gastrointestinal:  Negative for abdominal pain, blood in stool, diarrhea, nausea and vomiting.  Genitourinary:  Negative for difficulty urinating, dysuria, flank pain, frequency, hematuria and urgency.  Musculoskeletal:  Negative for arthralgias, back pain, joint swelling, myalgias and neck stiffness.  Skin:  Negative for pallor and rash.  Neurological:  Negative for dizziness, speech difficulty, weakness and headaches.  Hematological:  Negative for adenopathy. Does not bruise/bleed easily.  Psychiatric/Behavioral:  Negative for confusion and sleep disturbance. The patient is not nervous/anxious.     PE;    01/06/2024    2:25 PM 07/22/2023    9:20 AM 07/22/2023    9:10 AM  Vitals with BMI  Height 6' 0.5    Weight 204 lbs 3 oz    BMI 27.3    Systolic 104 127 886  Diastolic 56 67 72  Pulse 73 51 62   Gen: Alert, well appearing.  Patient is oriented to person, place, time, and situation. AFFECT: pleasant, lucid thought and speech. ENT: Ears: EACs clear, normal epithelium.  TMs with good light reflex and landmarks bilaterally.  Eyes: no injection, icteris, swelling, or exudate.  EOMI, PERRLA. Nose: no drainage or turbinate edema/swelling.  No injection or focal lesion.  Mouth: lips without lesion/swelling.  Oral mucosa pink and moist.  Dentition intact and without obvious caries or gingival swelling.  Oropharynx without erythema, exudate, or swelling.  Neck: supple/nontender.  No LAD, mass, or TM.  Carotid pulses 2+ bilaterally, without bruits. CV: RRR, no m/r/g.   LUNGS: CTA bilat, nonlabored resps, good aeration in all lung fields. ABD: soft, NT, ND, BS normal.  No hepatospenomegaly or mass.  No bruits. EXT: no clubbing, cyanosis, or edema.   Musculoskeletal: no joint swelling, erythema, warmth, or tenderness.  ROM of all joints intact. Skin - no sores or suspicious lesions or rashes or color changes  Pertinent labs:  Lab Results  Component Value Date   TSH 1.83 10/17/2019   Lab Results  Component Value Date   WBC 7.3 09/10/2022   HGB 15.3 09/10/2022   HCT 46.5 09/10/2022   MCV 88.3 09/10/2022   PLT 222.0 09/10/2022   Lab Results  Component Value Date   CREATININE 1.29 09/10/2022   BUN 14 09/10/2022   NA 139 09/10/2022   K 4.2 09/10/2022   CL 100 09/10/2022   CO2 29 09/10/2022   Lab Results  Component Value Date  ALT 33 09/10/2022   AST 25 09/10/2022   ALKPHOS 57 09/10/2022   BILITOT 0.7 09/10/2022   Lab Results  Component Value Date   CHOL 210 (H) 09/10/2022   Lab Results  Component Value Date   HDL 52.00 09/10/2022   Lab Results  Component Value Date   LDLCALC 133 (H) 09/10/2022   Lab Results  Component Value Date   TRIG 126.0 09/10/2022   Lab Results  Component Value Date   CHOLHDL 4 09/10/2022   Lab Results  Component Value Date   PSA 0.55 09/10/2022   PSA 0.44 10/17/2019   Lab Results  Component Value Date   HGBA1C 6.0 09/10/2022   ASSESSMENT AND PLAN:   No problem-specific Assessment & Plan notes found for this encounter.  #1 health maintenance exam: Reviewed age and gender appropriate health maintenance issues (prudent diet, regular exercise, health risks of tobacco and excessive alcohol, use of seatbelts, fire alarms in home, use of sunscreen).  Also reviewed age and gender appropriate health screening as well as vaccine recommendations. Vaccines: All up-to-date.   Labs: CBC, c-Met, lipid panel, PSA, hemoglobin A1c--> Future when fasting Prostate ca screening: He has a family history of prostate cancer in his father.  PSA ordered. Colon ca screening: [facility]CS 07/2023, adenomas-->recall 3 yrs.   #2 recurrent pulmonary emboli : He is on Xarelto  indefinitely. He ran out 2 weeks  ago. Refilled Xarelto  today. CBC monitoring today.   3.  Prediabetes. Hemoglobin A1c 6.0% in July 2024. Return for fasting glucose and hemoglobin A1c.  An After Visit Summary was printed and given to the patient.  FOLLOW UP:  No follow-ups on file.  Signed:  Gerlene Hockey, MD           01/06/2024

## 2024-01-13 ENCOUNTER — Other Ambulatory Visit (INDEPENDENT_AMBULATORY_CARE_PROVIDER_SITE_OTHER)

## 2024-01-13 DIAGNOSIS — Z125 Encounter for screening for malignant neoplasm of prostate: Secondary | ICD-10-CM | POA: Diagnosis not present

## 2024-01-13 DIAGNOSIS — Z0000 Encounter for general adult medical examination without abnormal findings: Secondary | ICD-10-CM

## 2024-01-13 DIAGNOSIS — Z7901 Long term (current) use of anticoagulants: Secondary | ICD-10-CM | POA: Diagnosis not present

## 2024-01-13 LAB — CBC WITH DIFFERENTIAL/PLATELET
Basophils Absolute: 0.1 K/uL (ref 0.0–0.1)
Basophils Relative: 1.1 % (ref 0.0–3.0)
Eosinophils Absolute: 0.2 K/uL (ref 0.0–0.7)
Eosinophils Relative: 2.5 % (ref 0.0–5.0)
HCT: 45 % (ref 39.0–52.0)
Hemoglobin: 15.1 g/dL (ref 13.0–17.0)
Lymphocytes Relative: 31.1 % (ref 12.0–46.0)
Lymphs Abs: 2.1 K/uL (ref 0.7–4.0)
MCHC: 33.4 g/dL (ref 30.0–36.0)
MCV: 87.8 fl (ref 78.0–100.0)
Monocytes Absolute: 0.4 K/uL (ref 0.1–1.0)
Monocytes Relative: 5.7 % (ref 3.0–12.0)
Neutro Abs: 4 K/uL (ref 1.4–7.7)
Neutrophils Relative %: 59.6 % (ref 43.0–77.0)
Platelets: 208 K/uL (ref 150.0–400.0)
RBC: 5.13 Mil/uL (ref 4.22–5.81)
RDW: 13.9 % (ref 11.5–15.5)
WBC: 6.7 K/uL (ref 4.0–10.5)

## 2024-01-13 LAB — COMPREHENSIVE METABOLIC PANEL WITH GFR
ALT: 17 U/L (ref 0–53)
AST: 17 U/L (ref 0–37)
Albumin: 4.4 g/dL (ref 3.5–5.2)
Alkaline Phosphatase: 59 U/L (ref 39–117)
BUN: 15 mg/dL (ref 6–23)
CO2: 30 meq/L (ref 19–32)
Calcium: 9.3 mg/dL (ref 8.4–10.5)
Chloride: 102 meq/L (ref 96–112)
Creatinine, Ser: 1.28 mg/dL (ref 0.40–1.50)
GFR: 66.65 mL/min (ref >60.00)
Glucose, Bld: 119 mg/dL — ABNORMAL HIGH (ref 70–99)
Potassium: 4.4 meq/L (ref 3.5–5.1)
Sodium: 139 meq/L (ref 135–145)
Total Bilirubin: 0.6 mg/dL (ref 0.2–1.2)
Total Protein: 6.4 g/dL (ref 6.0–8.3)

## 2024-01-13 LAB — LIPID PANEL
Cholesterol: 197 mg/dL (ref 0–200)
HDL: 45.7 mg/dL (ref >39.00)
LDL Cholesterol: 121 mg/dL — ABNORMAL HIGH (ref 0–99)
NonHDL: 151.26
Total CHOL/HDL Ratio: 4
Triglycerides: 153 mg/dL — ABNORMAL HIGH (ref 0.0–149.0)
VLDL: 30.6 mg/dL (ref 0.0–40.0)

## 2024-01-13 LAB — TSH: TSH: 1.97 u[IU]/mL (ref 0.35–5.50)

## 2024-01-13 LAB — PSA: PSA: 0.61 ng/mL (ref 0.10–4.00)

## 2024-01-17 ENCOUNTER — Ambulatory Visit: Payer: Self-pay | Admitting: Family Medicine

## 2024-01-17 DIAGNOSIS — R7301 Impaired fasting glucose: Secondary | ICD-10-CM

## 2024-01-18 ENCOUNTER — Ambulatory Visit (INDEPENDENT_AMBULATORY_CARE_PROVIDER_SITE_OTHER)

## 2024-01-18 DIAGNOSIS — R7301 Impaired fasting glucose: Secondary | ICD-10-CM | POA: Diagnosis not present

## 2024-01-18 LAB — HEMOGLOBIN A1C: Hgb A1c MFr Bld: 5.9 % (ref 4.6–6.5)

## 2024-01-18 NOTE — Telephone Encounter (Signed)
Lab add on request faxed.

## 2024-01-19 ENCOUNTER — Ambulatory Visit: Payer: Self-pay | Admitting: Family Medicine

## 2024-01-20 NOTE — Telephone Encounter (Signed)
 No further action needed at this time.
